# Patient Record
Sex: Female | Born: 1985 | Race: Black or African American | Hispanic: No | Marital: Single | Smoking: Current every day smoker
Health system: Southern US, Community
[De-identification: ages and names within clinical notes are randomized; demographics above are authoritative.]

## PROBLEM LIST (undated history)

## (undated) HISTORY — PX: TUBAL LIGATION: SHX77

## (undated) HISTORY — PX: ESSURE TUBAL LIGATION: SUR464

## (undated) HISTORY — PX: DILATION AND CURETTAGE OF UTERUS: SHX78

---

## 2010-06-26 ENCOUNTER — Emergency Department: Payer: Self-pay | Admitting: Emergency Medicine

## 2010-09-18 ENCOUNTER — Emergency Department: Payer: Self-pay | Admitting: Unknown Physician Specialty

## 2011-01-15 ENCOUNTER — Encounter: Payer: Self-pay | Admitting: Maternal and Fetal Medicine

## 2011-03-08 ENCOUNTER — Encounter: Payer: Self-pay | Admitting: Obstetrics and Gynecology

## 2011-04-30 ENCOUNTER — Observation Stay: Payer: Self-pay

## 2011-05-17 ENCOUNTER — Encounter: Payer: Self-pay | Admitting: Obstetrics & Gynecology

## 2011-07-10 ENCOUNTER — Observation Stay: Payer: Self-pay | Admitting: Obstetrics and Gynecology

## 2011-07-11 ENCOUNTER — Inpatient Hospital Stay: Payer: Self-pay

## 2011-07-11 LAB — CBC WITH DIFFERENTIAL/PLATELET
Basophil #: 0 10*3/uL (ref 0.0–0.1)
Basophil %: 0.3 %
Eosinophil #: 0.1 10*3/uL (ref 0.0–0.7)
Eosinophil %: 0.4 %
HCT: 34.7 % — ABNORMAL LOW (ref 35.0–47.0)
HGB: 11.7 g/dL — ABNORMAL LOW (ref 12.0–16.0)
MCHC: 33.9 g/dL (ref 32.0–36.0)
Monocyte #: 1.1 x10 3/mm — ABNORMAL HIGH (ref 0.2–0.9)
Monocyte %: 7.2 %
Neutrophil %: 80.5 %
RBC: 3.85 10*6/uL (ref 3.80–5.20)
RDW: 13.8 % (ref 11.5–14.5)

## 2011-07-12 LAB — HEMATOCRIT: HCT: 30.2 % — ABNORMAL LOW (ref 35.0–47.0)

## 2011-07-16 LAB — PATHOLOGY REPORT

## 2011-12-03 ENCOUNTER — Emergency Department: Payer: Self-pay | Admitting: Emergency Medicine

## 2012-06-02 ENCOUNTER — Emergency Department: Payer: Self-pay | Admitting: Emergency Medicine

## 2013-08-25 ENCOUNTER — Emergency Department: Payer: Self-pay | Admitting: Emergency Medicine

## 2013-08-25 LAB — BASIC METABOLIC PANEL
Anion Gap: 7 (ref 7–16)
BUN: 13 mg/dL (ref 7–18)
CALCIUM: 8.6 mg/dL (ref 8.5–10.1)
CHLORIDE: 106 mmol/L (ref 98–107)
Co2: 26 mmol/L (ref 21–32)
Creatinine: 0.8 mg/dL (ref 0.60–1.30)
EGFR (Non-African Amer.): >60
Glucose: 84 mg/dL (ref 65–99)
Osmolality: 277 (ref 275–301)
Potassium: 4.1 mmol/L (ref 3.5–5.1)
Sodium: 139 mmol/L (ref 136–145)

## 2013-08-25 LAB — CBC WITH DIFFERENTIAL/PLATELET
BASOS ABS: 0 10*3/uL (ref 0.0–0.1)
Basophil %: 0.7 %
EOS ABS: 0.1 10*3/uL (ref 0.0–0.7)
EOS PCT: 2.1 %
HCT: 38.7 % (ref 35.0–47.0)
HGB: 12.8 g/dL (ref 12.0–16.0)
Lymphocyte #: 2 10*3/uL (ref 1.0–3.6)
Lymphocyte %: 36.6 %
MCH: 30.8 pg (ref 26.0–34.0)
MCHC: 33.2 g/dL (ref 32.0–36.0)
MCV: 93 fL (ref 80–100)
Monocyte #: 0.6 x10 3/mm (ref 0.2–0.9)
Monocyte %: 10.3 %
NEUTROS PCT: 50.3 %
Neutrophil #: 2.7 10*3/uL (ref 1.4–6.5)
Platelet: 123 10*3/uL — ABNORMAL LOW (ref 150–440)
RBC: 4.17 10*6/uL (ref 3.80–5.20)
RDW: 13.6 % (ref 11.5–14.5)
WBC: 5.4 10*3/uL (ref 3.6–11.0)

## 2013-08-25 LAB — TSH: THYROID STIMULATING HORM: 0.423 u[IU]/mL — AB

## 2013-09-04 ENCOUNTER — Emergency Department: Payer: Self-pay | Admitting: Emergency Medicine

## 2013-10-10 ENCOUNTER — Emergency Department: Payer: Self-pay | Admitting: Emergency Medicine

## 2014-06-13 NOTE — Op Note (Signed)
PATIENT NAME:  Primus BravoRKER, Maria L MR#:  [id number] DATE OF BIRTH:  1985-11-20  DATE OF PROCEDURE:  07/12/2011  PREOPERATIVE DIAGNOSIS: Postpartum, desires sterilization.   POSTOPERATIVE DIAGNOSIS: Postpartum, desires sterilization.   PROCEDURE:  Bilateral partial salpingectomy.   SURGEON: Deloris Pinghilip J. Luella Cookosenow, M.D.   ANESTHESIA: General.   OPERATIVE FINDINGS: Normal appearing tubes.   DESCRIPTION OF PROCEDURE:   After adequate general anesthesia, the patient was prepped and draped in routine fashion. An infraumbilical incision was made through the skin and carried down the various layers and the peritoneal cavity was entered. The right tube was grasped and after adequate identification of the right fimbria, the right tube was doubly ligated and a portion removed. A like procedure was carried out on the other side. Both areas of surgery were inspected and found to be hemostatic. The peritoneum was closed with a pursestring suture. The fascia was reapproximated with two interrupted sutures of Vicryl and the skin was closed with subcuticular stitch. The patient tolerated the procedure well and left the Operating Room in good condition. Sponge and needle counts were said to be correct at the end of the procedure.   ____________________________ Deloris PingPhilip J. Luella Cookosenow, MD pjr:ap D: 07/12/2011 13:42:22 ET T: 07/12/2011 16:08:02 ET JOB#: [id number]  cc: Deloris PingPhilip J. Luella Cookosenow, MD, <Dictator>  Towana BadgerPHILIP J Melayah Skorupski MD ELECTRONICALLY SIGNED 07/13/2011 17:04

## 2014-06-29 NOTE — H&P (Signed)
L&D Evaluation:  History:   HPI 29 yo G4 P2012 with EDD of 07/24/11 per LMP and 13 week US presents at 6838 1/7 with c/o strong regular contractions since 0600 this AM. Denies LOF or VB. PNC at [facility] notable for early entry to care, h/o IUFD at 22 weeks (pt declined DP consult), mild anemia, normal growth scans, 2 prior term vaginal deliveries.Desires BTL and 30 day papers signed 03/15/2011. LABS: A POS, VI, RI, GBs negative. Recieved TDAP this pregnancy.    Presents with contractions    Patient's Medical History No Chronic Illness  seizure at age 89 r/t fever (?)    Patient's Surgical History D&C    Medications Pre [place]atal Vitamins    Allergies NKDA    Social History tobacco  quit with pregnancy    Family History Non-Contributory   ROS:   ROS see HPI   Exam:   General breathing with xcontractions    Mental Status clear    Chest clear    Heart normal sinus rhythm, no murmur/gallop/rubs    Abdomen gravid, tender with contractions    Estimated Fetal Weight Average for gestational age    Fetal Position vttx    Edema trace edema    Reflexes 1+    Pelvic 4/C/-1    Mebranes Intact    FHT normal rate with no decels, 135 with accels to 150    FHT Description mod variability    Ucx strong q3-4    Skin dry   Impression:   Impression IUP at 38 1/7 weeks in active labor   Plan:   Plan monitor contractions and for cervical change, Expectant management. Stadol for pain   Electronic Signatures: Trinna BalloonGutierrez, Halle Davlin L (CNM)  (Signed 22-May-13 08:36)  Authored: L&D Evaluation   Last Updated: 22-May-13 08:36 by Trinna BalloonGutierrez, Rickia Freeburg L (CNM)

## 2014-06-29 NOTE — H&P (Signed)
L&D Evaluation:  History Expanded:   HPI 29 yo G4 P2012 with EDD of 07/24/11 per LMP and 13 week US. PNC at [facility] notable for early entry to care, h/o IUFD at 22 weeks (pt declined DP consult), 2 prior term vaginal deliveries.  Presents today with c/o groin/low pubic pain most notable at night with movement. Does not believe that she is having contractions. Denies LOF or VB. Good FM.    Blood Type A positive    Group B Strep Results (Result >5wks must be treated as unknown) unknown/result > 5 weeks ago    Maternal HIV Negative    Maternal Syphilis Ab Nonreactive    Maternal Varicella Immune    Rubella Results immune    Maternal T-Dap Nonimmune    Patient's Medical History No Chronic Illness  seizure at age 89 r/t fever (?)    Patient's Surgical History none    Medications Pre Natal Vitamins    Allergies NKDA    Social History tobacco   ROS:   ROS see HPI   Exam:   Vital Signs stable    General no apparent distress    Mental Status clear    Chest clear    Heart no murmur/gallop/rubs    Abdomen gravid, non-tender    Pelvic deferred as pt is not contracting    Mebranes Intact    FHT appropriate for gestational age    Ucx absent    Skin dry   Impression:   Impression Round ligament pain   Plan:   Comments D/c home F/u with [facility] as scheduled on 3/14   Electronic Signatures: Vella KohlerBrothers, Meric Joye K (CNM)  (Signed 11-Mar-13 12:27)  Authored: L&D Evaluation   Last Updated: 11-Mar-13 12:27 by Vella KohlerBrothers, Yared Barefoot K (CNM)

## 2014-07-21 ENCOUNTER — Emergency Department
Admission: EM | Admit: 2014-07-21 | Discharge: 2014-07-21 | Discharge status: Against Medical Advice | Payer: No Typology Code available for payment source | Attending: Emergency Medicine | Admitting: Emergency Medicine

## 2014-07-21 DIAGNOSIS — S4990XA Unspecified injury of shoulder and upper arm, unspecified arm, initial encounter: Secondary | ICD-10-CM | POA: Insufficient documentation

## 2014-07-21 DIAGNOSIS — Y92410 Unspecified street and highway as the place of occurrence of the external cause: Secondary | ICD-10-CM | POA: Insufficient documentation

## 2014-07-21 DIAGNOSIS — Y939 Activity, unspecified: Secondary | ICD-10-CM | POA: Insufficient documentation

## 2014-07-21 DIAGNOSIS — Y999 Unspecified external cause status: Secondary | ICD-10-CM | POA: Insufficient documentation

## 2014-07-21 DIAGNOSIS — S3992XA Unspecified injury of lower back, initial encounter: Secondary | ICD-10-CM | POA: Insufficient documentation

## 2014-07-21 NOTE — ED Notes (Signed)
No answer when called from lobby 

## 2015-10-07 ENCOUNTER — Emergency Department
Admission: EM | Admit: 2015-10-07 | Discharge: 2015-10-07 | Disposition: A | Discharge status: Home / Self Care | Payer: Self-pay | Attending: Emergency Medicine | Admitting: Emergency Medicine

## 2015-10-07 ENCOUNTER — Encounter: Payer: Self-pay | Admitting: Emergency Medicine

## 2015-10-07 DIAGNOSIS — B009 Herpesviral infection, unspecified: Secondary | ICD-10-CM | POA: Insufficient documentation

## 2015-10-07 DIAGNOSIS — F17200 Nicotine dependence, unspecified, uncomplicated: Secondary | ICD-10-CM | POA: Insufficient documentation

## 2015-10-07 MED ORDER — ACYCLOVIR 800 MG PO TABS: 800.0000 mg | ORAL_TABLET | Freq: Every day | ORAL | 0 refills | Status: DC

## 2015-10-07 NOTE — ED Triage Notes (Signed)
Pt has had small bump on left lower lip for couple days but reports got bigger today. Thinks may have got bit by something. Minimal swelling. No respiratory difficulty. Burning feeling to area.

## 2015-10-07 NOTE — ED Provider Notes (Signed)
[facility] Emergency Department Provider Note  ____________________________________________  Time seen: Approximately 2:15 PM  I have reviewed the triage vital signs and the nursing notes.   HISTORY  Chief Complaint Insect Bite    HPI Primus Bravoshley L Bonus is a 30 y.o. female who presents emergency department for a "bump" to the left lower lip. Patient reports that symptoms have been present times "a few days." It is increasing in size. She denies any pruritus but endorses a "burning sensation." Patient denies any other symptoms to include URI symptoms, fevers or chills, abdominal pain, nausea or vomiting. Patient is not trying medications for this complaint prior to arrival.   History reviewed. No pertinent past medical history.  There are no active problems to display for this patient.   Past Surgical History:  Procedure Laterality Date  . DILATION AND CURETTAGE OF UTERUS    . ESSURE TUBAL LIGATION      Prior to Admission medications   Medication Sig Start Date End Date Taking? Authorizing Provider  acyclovir (ZOVIRAX) 800 MG tablet Take 1 tablet (800 mg total) by mouth 5 (five) times daily. 10/07/15   Delorise RoyalsJonathan D Emilyrose Darrah, PA-C    Allergies Review of patient's allergies indicates no known allergies.  History reviewed. No pertinent family history.  Social History Social History  Substance Use Topics  . Smoking status: Current Every Day Smoker  . Smokeless tobacco: Never Used  . Alcohol use No     Review of Systems  Constitutional: No fever/chills Eyes: No visual changes. No discharge ENT: Positive for "bump" to the left lower lip. Cardiovascular: no chest pain. Respiratory: no cough. No SOB. Gastrointestinal: No abdominal pain.  No nausea, no vomiting.   Musculoskeletal: Negative for musculoskeletal pain. Skin: Negative for rash, abrasions, lacerations, ecchymosis. Neurological: Negative for headaches, focal weakness or numbness. 10-point  ROS otherwise negative.  ____________________________________________   PHYSICAL EXAM:  VITAL SIGNS: ED Triage Vitals [10/07/15 1329]  Enc Vitals Group     BP 128/84     Pulse Rate 77     Resp 16     Temp 99.2 F (37.3 C)     Temp Source Oral     SpO2 100 %     Weight 119 lb (54 kg)     Height 4\' 11"  (1.499 m)     Head Circumference      Peak Flow      Pain Score 5     Pain Loc      Pain Edu?      Excl. in GC?      Constitutional: Alert and oriented. Well appearing and in no acute distress. Eyes: Conjunctivae are normal. PERRL. EOMI. Head: Atraumatic. ENT:      Ears:       Nose: No congestion/rhinnorhea.      Mouth/Throat: Mucous membranes are moist. Oropharynx nonerythematous and nonedematous. There is a erythematous lesion noted to the left lower lip. This appears to be a cold sore. Neck: No stridor.   Hematological/Lymphatic/Immunilogical: No cervical lymphadenopathy. Cardiovascular: Normal rate, regular rhythm. Normal S1 and S2.  Good peripheral circulation. Respiratory: Normal respiratory effort without tachypnea or retractions. Lungs CTAB. Good air entry to the bases with no decreased or absent breath sounds. Musculoskeletal: Full range of motion to all extremities. No gross deformities appreciated. Neurologic:  Normal speech and language. No gross focal neurologic deficits are appreciated.  Skin:  Skin is warm, dry and intact. No rash noted. Psychiatric: Mood and affect are normal. Speech  and behavior are normal. Patient exhibits appropriate insight and judgement.   ____________________________________________   LABS (all labs ordered are listed, but only abnormal results are displayed)  Labs Reviewed - No data to display ____________________________________________  EKG   ____________________________________________  RADIOLOGY   No results found.  ____________________________________________    PROCEDURES  Procedure(s) performed:     Procedures    Medications - No data to display   ____________________________________________   INITIAL IMPRESSION / ASSESSMENT AND PLAN / ED COURSE  Pertinent labs & imaging results that were available during my care of the patient were reviewed by me and considered in my medical decision making (see chart for details).  Clinical Course    Patient's diagnosis is consistent with HSV 1 cold sore to the left lower lip.. Patient will be discharged home with prescriptions for antivirals. Patient is to follow up with primary care provider as needed or otherwise directed. Patient is given ED precautions to return to the ED for any worsening or new symptoms.     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 Despite best efforts to proofread, errors can occur which can change the meaning. Any change was purely unintentional.    Racheal PatchesJonathan D Mikalyn Hermida, PA-C 10/07/15 1536    Minna AntisKevin Paduchowski, MD 10/07/15 [phone]

## 2016-07-24 ENCOUNTER — Other Ambulatory Visit: Payer: Self-pay | Admitting: Primary Care

## 2016-07-24 DIAGNOSIS — N921 Excessive and frequent menstruation with irregular cycle: Secondary | ICD-10-CM

## 2016-07-27 ENCOUNTER — Ambulatory Visit: Payer: Self-pay

## 2016-09-10 ENCOUNTER — Ambulatory Visit: Payer: No Typology Code available for payment source

## 2020-01-25 ENCOUNTER — Encounter
Admission: RE | Admit: 2020-01-25 | Discharge: 2020-01-25 | Disposition: A | Discharge status: Home / Self Care | Payer: No Typology Code available for payment source | Source: Ambulatory Visit | Attending: Obstetrics & Gynecology | Admitting: Obstetrics & Gynecology

## 2020-01-25 ENCOUNTER — Other Ambulatory Visit: Payer: Self-pay

## 2020-01-25 NOTE — Patient Instructions (Signed)
Your procedure is scheduled on: 02/02/20 Report to [facility]. To find out your arrival time please call [phone] between 1PM - 3PM on 02/01/20.  Remember: Instructions that are not followed completely may result in serious medical risk, up to and including death, or upon the discretion of your surgeon and anesthesiologist your surgery may need to be rescheduled.     _X__ 1. Do not eat food after midnight the night before your procedure.                 No gum chewing or hard candies. You may drink clear liquids up to 2 hours                 before you are scheduled to arrive for your surgery- DO not drink clear                 liquids within 2 hours of the start of your surgery.                 Clear Liquids include:  water, apple juice without pulp, clear carbohydrate                 drink such as Clearfast or Gatorade, Black Coffee or Tea (Do not add                 anything to coffee or tea). Diabetics water only  __X__2.  On the morning of surgery brush your teeth with toothpaste and water, you                 may rinse your mouth with mouthwash if you wish.  Do not swallow any              toothpaste of mouthwash.     _X__ 3.  No Alcohol for 24 hours before or after surgery.   _X__ 4.  Do Not Smoke or use e-cigarettes For 24 Hours Prior to Your Surgery.                 Do not use any chewable tobacco products for at least 6 hours prior to                 surgery.  ____  5.  Bring all medications with you on the day of surgery if instructed.   __X__  6.  Notify your doctor if there is any change in your medical condition      (cold, fever, infections).     Do not wear jewelry, make-up, hairpins, clips or nail polish. Do not wear lotions, powders, or perfumes.  Do not shave 48 hours prior to surgery. Men may shave face and neck. Do not bring valuables to the hospital.    [facility] is not responsible for any belongings  or valuables.  Contacts, dentures/partials or body piercings may not be worn into surgery. Bring a case for your contacts, glasses or hearing aids, a denture cup will be supplied. Leave your suitcase in the car. After surgery it may be brought to your room. For patients admitted to the hospital, discharge time is determined by your treatment team.   Patients discharged the day of surgery will not be allowed to drive home.   Please read over the following fact sheets that you were given:   MRSA Information  __X__ Take these medicines the morning of surgery with A SIP OF WATER:  1. none  2.   3.   4.  5.  6.  ____ Fleet Enema (as directed)   __X__ Use CHG Soap/SAGE wipes as directed  ____ Use inhalers on the day of surgery  ____ Stop metformin/Janumet/Farxiga 2 days prior to surgery    ____ Take 1/2 of usual insulin dose the night before surgery. No insulin the morning          of surgery.   ____ Stop Blood Thinners Coumadin/Plavix/Xarelto/Pleta/Pradaxa/Eliquis/Effient/Aspirin  on   Or contact your Surgeon, Cardiologist or Medical Doctor regarding  ability to stop your blood thinners  __X__ Stop Anti-inflammatories 7 days before surgery such as Advil, Ibuprofen, Motrin,  BC or Goodies Powder, Naprosyn, Naproxen, Aleve, Aspirin    __X__ Stop all herbal supplements, fish oil or vitamin E until after surgery.    ____ Bring C-Pap to the hospital.    THE ENSURE "CLEAR" PRE SURGERY DRINK SHOULD BE FINISHED WITHIN 5-10 MINUTES 2 HOURS BEFORE ARRIVING TO SURGERY

## 2020-01-28 ENCOUNTER — Other Ambulatory Visit: Payer: Self-pay | Admitting: Obstetrics & Gynecology

## 2020-01-29 ENCOUNTER — Other Ambulatory Visit: Payer: Self-pay

## 2020-01-29 ENCOUNTER — Other Ambulatory Visit
Admission: RE | Admit: 2020-01-29 | Discharge: 2020-01-29 | Disposition: A | Discharge status: Home / Self Care | Payer: No Typology Code available for payment source | Source: Ambulatory Visit | Attending: Obstetrics & Gynecology | Admitting: Obstetrics & Gynecology

## 2020-01-29 DIAGNOSIS — Z01812 Encounter for preprocedural laboratory examination: Secondary | ICD-10-CM | POA: Diagnosis present

## 2020-01-29 DIAGNOSIS — Z20822 Contact with and (suspected) exposure to covid-19: Secondary | ICD-10-CM | POA: Diagnosis not present

## 2020-01-29 LAB — BASIC METABOLIC PANEL
Anion gap: 10 (ref 5–15)
BUN: 10 mg/dL (ref 6–20)
CO2: 23 mmol/L (ref 22–32)
Calcium: 8.9 mg/dL (ref 8.9–10.3)
Chloride: 105 mmol/L (ref 98–111)
Creatinine, Ser: 0.63 mg/dL (ref 0.44–1.00)
GFR, Estimated: >60 mL/min (ref >60)
Glucose, Bld: 95 mg/dL (ref 70–99)
Potassium: 3.7 mmol/L (ref 3.5–5.1)
Sodium: 138 mmol/L (ref 135–145)

## 2020-01-29 LAB — CBC
HCT: 38.8 % (ref 36.0–46.0)
Hemoglobin: 12.9 g/dL (ref 12.0–15.0)
MCH: 30.7 pg (ref 26.0–34.0)
MCHC: 33.2 g/dL (ref 30.0–36.0)
MCV: 92.4 fL (ref 80.0–100.0)
Platelets: 141 10*3/uL — ABNORMAL LOW (ref 150–400)
RBC: 4.2 MIL/uL (ref 3.87–5.11)
RDW: 13.5 % (ref 11.5–15.5)
WBC: 5 10*3/uL (ref 4.0–10.5)
nRBC: 0 % (ref 0.0–0.2)

## 2020-01-30 LAB — TYPE AND SCREEN
ABO/RH(D): A POS
Antibody Screen: NEGATIVE

## 2020-01-30 LAB — SARS CORONAVIRUS 2 (TAT 6-24 HRS): SARS Coronavirus 2: NEGATIVE

## 2020-02-02 ENCOUNTER — Ambulatory Visit: Payer: No Typology Code available for payment source | Admitting: Certified Registered"

## 2020-02-02 ENCOUNTER — Other Ambulatory Visit: Payer: Self-pay

## 2020-02-02 ENCOUNTER — Encounter: Payer: Self-pay | Admitting: Obstetrics & Gynecology

## 2020-02-02 ENCOUNTER — Ambulatory Visit
Admission: RE | Admit: 2020-02-02 | Discharge: 2020-02-02 | Disposition: A | Discharge status: Home / Self Care | Payer: No Typology Code available for payment source | Attending: Obstetrics & Gynecology | Admitting: Obstetrics & Gynecology

## 2020-02-02 ENCOUNTER — Encounter
Admission: RE | Disposition: A | Discharge status: Home / Self Care | Payer: Self-pay | Source: Home / Self Care | Attending: Obstetrics & Gynecology

## 2020-02-02 DIAGNOSIS — N921 Excessive and frequent menstruation with irregular cycle: Secondary | ICD-10-CM | POA: Diagnosis present

## 2020-02-02 DIAGNOSIS — Z791 Long term (current) use of non-steroidal anti-inflammatories (NSAID): Secondary | ICD-10-CM | POA: Insufficient documentation

## 2020-02-02 DIAGNOSIS — N72 Inflammatory disease of cervix uteri: Secondary | ICD-10-CM | POA: Diagnosis not present

## 2020-02-02 DIAGNOSIS — F17210 Nicotine dependence, cigarettes, uncomplicated: Secondary | ICD-10-CM | POA: Diagnosis not present

## 2020-02-02 DIAGNOSIS — N800 Endometriosis of uterus: Secondary | ICD-10-CM | POA: Diagnosis not present

## 2020-02-02 HISTORY — PX: TOTAL LAPAROSCOPIC HYSTERECTOMY WITH SALPINGECTOMY: SHX6742

## 2020-02-02 LAB — ABO/RH: ABO/RH(D): A POS

## 2020-02-02 LAB — POCT PREGNANCY, URINE: Preg Test, Ur: NEGATIVE

## 2020-02-02 SURGERY — HYSTERECTOMY, TOTAL, LAPAROSCOPIC, WITH SALPINGECTOMY
Anesthesia: General | Laterality: Bilateral

## 2020-02-02 MED ORDER — DEXAMETHASONE SODIUM PHOSPHATE 10 MG/ML IJ SOLN: 4.0000 mg | INTRAMUSCULAR | Status: AC

## 2020-02-02 MED ORDER — LACTATED RINGERS IV SOLN: INTRAVENOUS | Status: DC

## 2020-02-02 MED ORDER — ACETAMINOPHEN 500 MG PO TABS: 1000.0000 mg | ORAL_TABLET | ORAL | Status: AC

## 2020-02-02 MED ORDER — FAMOTIDINE 20 MG PO TABS: 20.0000 mg | ORAL_TABLET | Freq: Once | ORAL | Status: AC

## 2020-02-02 MED ORDER — HYDROMORPHONE HCL 1 MG/ML IJ SOLN
INTRAMUSCULAR | Status: AC
  Filled 2020-02-02: qty 1

## 2020-02-02 MED ORDER — SUGAMMADEX SODIUM 200 MG/2ML IV SOLN
INTRAVENOUS | Status: DC | PRN
  Administered 2020-02-02: 200 mg via INTRAVENOUS

## 2020-02-02 MED ORDER — CEFAZOLIN SODIUM-DEXTROSE 2-4 GM/100ML-% IV SOLN
2.0000 g | INTRAVENOUS | Status: AC
  Administered 2020-02-02: 11:00:00 2 g via INTRAVENOUS

## 2020-02-02 MED ORDER — FENTANYL CITRATE (PF) 100 MCG/2ML IJ SOLN
INTRAMUSCULAR | Status: AC
  Administered 2020-02-02: 14:00:00 25 ug via INTRAVENOUS
  Filled 2020-02-02: qty 2

## 2020-02-02 MED ORDER — POVIDONE-IODINE 10 % EX SWAB
2.0000 "application " | Freq: Once | CUTANEOUS | Status: AC
  Administered 2020-02-02: 2 via TOPICAL

## 2020-02-02 MED ORDER — KETOROLAC TROMETHAMINE 15 MG/ML IJ SOLN
INTRAMUSCULAR | Status: AC
  Administered 2020-02-02: 14:00:00 15 mg via INTRAVENOUS
  Filled 2020-02-02: qty 1

## 2020-02-02 MED ORDER — FENTANYL CITRATE (PF) 100 MCG/2ML IJ SOLN
INTRAMUSCULAR | Status: AC
  Filled 2020-02-02: qty 2

## 2020-02-02 MED ORDER — ROCURONIUM BROMIDE 100 MG/10ML IV SOLN
INTRAVENOUS | Status: DC | PRN
  Administered 2020-02-02: 40 mg via INTRAVENOUS
  Administered 2020-02-02: 10 mg via INTRAVENOUS

## 2020-02-02 MED ORDER — ORAL CARE MOUTH RINSE: 15.0000 mL | Freq: Once | OROMUCOSAL | Status: AC

## 2020-02-02 MED ORDER — KETOROLAC TROMETHAMINE 15 MG/ML IJ SOLN
INTRAMUSCULAR | Status: AC
  Administered 2020-02-02: 10:00:00 15 mg via INTRAVENOUS
  Filled 2020-02-02: qty 1

## 2020-02-02 MED ORDER — FENTANYL CITRATE (PF) 100 MCG/2ML IJ SOLN
25.0000 ug | INTRAMUSCULAR | Status: DC | PRN
  Administered 2020-02-02 (×3): 25 ug via INTRAVENOUS

## 2020-02-02 MED ORDER — SCOPOLAMINE 1 MG/3DAYS TD PT72
MEDICATED_PATCH | TRANSDERMAL | Status: AC
  Administered 2020-02-02: 10:00:00 1.5 mg via TRANSDERMAL
  Filled 2020-02-02: qty 1

## 2020-02-02 MED ORDER — MIDAZOLAM HCL 2 MG/2ML IJ SOLN
INTRAMUSCULAR | Status: AC
  Filled 2020-02-02: qty 2

## 2020-02-02 MED ORDER — CHLORHEXIDINE GLUCONATE 0.12 % MT SOLN
OROMUCOSAL | Status: AC
  Administered 2020-02-02: 10:00:00 15 mL via OROMUCOSAL
  Filled 2020-02-02: qty 15

## 2020-02-02 MED ORDER — DEXAMETHASONE SODIUM PHOSPHATE 10 MG/ML IJ SOLN
INTRAMUSCULAR | Status: DC | PRN
  Administered 2020-02-02: 10 mg via INTRAVENOUS

## 2020-02-02 MED ORDER — KETOROLAC TROMETHAMINE 15 MG/ML IJ SOLN: 15.0000 mg | INTRAMUSCULAR | Status: AC

## 2020-02-02 MED ORDER — ACETAMINOPHEN 325 MG PO TABS: 650.0000 mg | ORAL_TABLET | ORAL | Status: DC | PRN

## 2020-02-02 MED ORDER — GABAPENTIN 300 MG PO CAPS
ORAL_CAPSULE | ORAL | Status: AC
  Administered 2020-02-02: 10:00:00 600 mg via ORAL
  Filled 2020-02-02: qty 2

## 2020-02-02 MED ORDER — ACETAMINOPHEN 325 MG PO TABS
ORAL_TABLET | ORAL | Status: AC
  Administered 2020-02-02: 14:00:00 650 mg via ORAL
  Filled 2020-02-02: qty 2

## 2020-02-02 MED ORDER — IBUPROFEN 800 MG PO TABS: 800.0000 mg | ORAL_TABLET | Freq: Four times a day (QID) | ORAL | 0 refills | Status: AC

## 2020-02-02 MED ORDER — ACETAMINOPHEN 650 MG RE SUPP
650.0000 mg | RECTAL | Status: DC | PRN
  Filled 2020-02-02: qty 1

## 2020-02-02 MED ORDER — ACETAMINOPHEN 500 MG PO TABS
ORAL_TABLET | ORAL | Status: AC
  Administered 2020-02-02: 10:00:00 1000 mg via ORAL
  Filled 2020-02-02: qty 2

## 2020-02-02 MED ORDER — ENSURE PRE-SURGERY PO LIQD
296.0000 mL | Freq: Once | ORAL | Status: DC
  Filled 2020-02-02: qty 296

## 2020-02-02 MED ORDER — SCOPOLAMINE 1 MG/3DAYS TD PT72: 1.0000 | MEDICATED_PATCH | TRANSDERMAL | Status: DC

## 2020-02-02 MED ORDER — HEPARIN SODIUM (PORCINE) 5000 UNIT/ML IJ SOLN: 5000.0000 [IU] | INTRAMUSCULAR | Status: AC

## 2020-02-02 MED ORDER — HEPARIN SODIUM (PORCINE) 5000 UNIT/ML IJ SOLN
INTRAMUSCULAR | Status: AC
  Administered 2020-02-02: 10:00:00 5000 [IU] via SUBCUTANEOUS
  Filled 2020-02-02: qty 1

## 2020-02-02 MED ORDER — GABAPENTIN 300 MG PO CAPS: 600.0000 mg | ORAL_CAPSULE | ORAL | Status: AC

## 2020-02-02 MED ORDER — OXYCODONE HCL 5 MG PO TABS: 5.0000 mg | ORAL_TABLET | ORAL | 0 refills | Status: AC | PRN

## 2020-02-02 MED ORDER — LIDOCAINE HCL (CARDIAC) PF 100 MG/5ML IV SOSY
PREFILLED_SYRINGE | INTRAVENOUS | Status: DC | PRN
  Administered 2020-02-02: 80 mg via INTRAVENOUS

## 2020-02-02 MED ORDER — FAMOTIDINE 20 MG PO TABS
ORAL_TABLET | ORAL | Status: AC
  Administered 2020-02-02: 10:00:00 20 mg via ORAL
  Filled 2020-02-02: qty 1

## 2020-02-02 MED ORDER — CEFAZOLIN SODIUM-DEXTROSE 2-4 GM/100ML-% IV SOLN
INTRAVENOUS | Status: AC
  Filled 2020-02-02: qty 100

## 2020-02-02 MED ORDER — EPHEDRINE SULFATE 50 MG/ML IJ SOLN
INTRAMUSCULAR | Status: DC | PRN
  Administered 2020-02-02 (×2): 10 mg via INTRAVENOUS

## 2020-02-02 MED ORDER — MIDAZOLAM HCL 2 MG/2ML IJ SOLN
INTRAMUSCULAR | Status: DC | PRN
  Administered 2020-02-02: 2 mg via INTRAVENOUS

## 2020-02-02 MED ORDER — HYDROMORPHONE HCL 1 MG/ML IJ SOLN
INTRAMUSCULAR | Status: DC | PRN
  Administered 2020-02-02 (×2): .2 mg via INTRAVENOUS

## 2020-02-02 MED ORDER — FENTANYL CITRATE (PF) 100 MCG/2ML IJ SOLN
INTRAMUSCULAR | Status: DC | PRN
  Administered 2020-02-02: 50 ug via INTRAVENOUS

## 2020-02-02 MED ORDER — PROPOFOL 10 MG/ML IV BOLUS
INTRAVENOUS | Status: AC
  Filled 2020-02-02: qty 20

## 2020-02-02 MED ORDER — FENTANYL CITRATE (PF) 100 MCG/2ML IJ SOLN: 25.0000 ug | INTRAMUSCULAR | Status: DC | PRN

## 2020-02-02 MED ORDER — KETOROLAC TROMETHAMINE 15 MG/ML IJ SOLN: 15.0000 mg | Freq: Four times a day (QID) | INTRAMUSCULAR | Status: DC

## 2020-02-02 MED ORDER — PROPOFOL 10 MG/ML IV BOLUS
INTRAVENOUS | Status: DC | PRN
  Administered 2020-02-02: 50 mg via INTRAVENOUS
  Administered 2020-02-02: 130 mg via INTRAVENOUS
  Administered 2020-02-02: 20 mg via INTRAVENOUS

## 2020-02-02 MED ORDER — CHLORHEXIDINE GLUCONATE 0.12 % MT SOLN: 15.0000 mL | Freq: Once | OROMUCOSAL | Status: AC

## 2020-02-02 MED ORDER — PHENYLEPHRINE HCL (PRESSORS) 10 MG/ML IV SOLN
INTRAVENOUS | Status: DC | PRN
  Administered 2020-02-02 (×6): 100 ug via INTRAVENOUS
  Administered 2020-02-02: 200 ug via INTRAVENOUS

## 2020-02-02 MED ORDER — ACETAMINOPHEN 500 MG PO TABS: 1000.0000 mg | ORAL_TABLET | Freq: Four times a day (QID) | ORAL | 2 refills | Status: AC

## 2020-02-02 MED ORDER — FENTANYL CITRATE (PF) 100 MCG/2ML IJ SOLN
INTRAMUSCULAR | Status: AC
  Administered 2020-02-02: 13:00:00 25 ug via INTRAVENOUS
  Filled 2020-02-02: qty 2

## 2020-02-02 MED ORDER — ONDANSETRON HCL 4 MG/2ML IJ SOLN
INTRAMUSCULAR | Status: AC
  Filled 2020-02-02: qty 2

## 2020-02-02 MED ORDER — ONDANSETRON HCL 4 MG/2ML IJ SOLN
4.0000 mg | Freq: Once | INTRAMUSCULAR | Status: AC | PRN
  Administered 2020-02-02: 15:00:00 4 mg via INTRAVENOUS

## 2020-02-02 MED ORDER — DEXAMETHASONE SODIUM PHOSPHATE 10 MG/ML IJ SOLN
INTRAMUSCULAR | Status: AC
  Administered 2020-02-02: 10:00:00 4 mg via INTRAVENOUS
  Filled 2020-02-02: qty 1

## 2020-02-02 MED ORDER — ONDANSETRON HCL 4 MG/2ML IJ SOLN
INTRAMUSCULAR | Status: DC | PRN
  Administered 2020-02-02: 4 mg via INTRAVENOUS

## 2020-02-02 SURGICAL SUPPLY — 66 items
ADH SKN CLS APL DERMABOND .7 (GAUZE/BANDAGES/DRESSINGS) ×1
APL PRP STRL LF DISP 70% ISPRP (MISCELLANEOUS) ×2
APL SRG 38 LTWT LNG FL B (MISCELLANEOUS)
APPLICATOR ARISTA FLEXITIP XL (MISCELLANEOUS) IMPLANT
BACTOSHIELD CHG 4% 4OZ (MISCELLANEOUS) ×1
BAG DRN RND TRDRP ANRFLXCHMBR (UROLOGICAL SUPPLIES) ×1
BAG URINE DRAIN 2000ML AR STRL (UROLOGICAL SUPPLIES) ×2 IMPLANT
BINDER ABDOMINAL  9 SM 30-45 (SOFTGOODS) ×1
BINDER ABDOMINAL 12 ML 46-62 (SOFTGOODS) IMPLANT
BINDER ABDOMINAL 9 SM 30-45 (SOFTGOODS) ×1 IMPLANT
BLADE SURG SZ11 CARB STEEL (BLADE) ×2 IMPLANT
CATH FOLEY 2WAY  5CC 16FR (CATHETERS) ×1
CATH FOLEY 2WAY 5CC 16FR (CATHETERS) ×1
CATH URTH 16FR FL 2W BLN LF (CATHETERS) ×1 IMPLANT
CHLORAPREP W/TINT 26 (MISCELLANEOUS) ×4 IMPLANT
COUNTER NEEDLE 20/40 LG (NEEDLE) ×2 IMPLANT
COVER WAND RF STERILE (DRAPES) ×2 IMPLANT
DEFOGGER SCOPE WARMER CLEARIFY (MISCELLANEOUS) ×2 IMPLANT
DERMABOND ADVANCED (GAUZE/BANDAGES/DRESSINGS) ×1
DERMABOND ADVANCED .7 DNX12 (GAUZE/BANDAGES/DRESSINGS) ×1 IMPLANT
DEVICE SUTURE ENDOST 10MM (ENDOMECHANICALS) IMPLANT
DRAPE 3/4 80X56 (DRAPES) ×2 IMPLANT
DRAPE LEGGINS SURG 28X43 STRL (DRAPES) ×2 IMPLANT
DRAPE UNDER BUTTOCK W/FLU (DRAPES) ×2 IMPLANT
ELECT REM PT RETURN 9FT ADLT (ELECTROSURGICAL) ×2
ELECTRODE REM PT RTRN 9FT ADLT (ELECTROSURGICAL) ×1 IMPLANT
GAUZE SPONGE 4X4 16PLY XRAY LF (GAUZE/BANDAGES/DRESSINGS) ×2 IMPLANT
GLOVE PI ORTHOPRO 6.5 (GLOVE) ×6
GLOVE PI ORTHOPRO STRL 6.5 (GLOVE) ×6 IMPLANT
GLOVE SURG SYN 6.5 ES PF (GLOVE) ×12 IMPLANT
GOWN STRL REUS W/ TWL LRG LVL3 (GOWN DISPOSABLE) ×4 IMPLANT
GOWN STRL REUS W/ TWL XL LVL3 (GOWN DISPOSABLE) ×3 IMPLANT
GOWN STRL REUS W/TWL LRG LVL3 (GOWN DISPOSABLE) ×8
GOWN STRL REUS W/TWL XL LVL3 (GOWN DISPOSABLE) ×6
HANDLE YANKAUER SUCT BULB TIP (MISCELLANEOUS) ×2 IMPLANT
HEMOSTAT ARISTA ABSORB 3G PWDR (HEMOSTASIS) ×2 IMPLANT
IRRIGATION STRYKERFLOW (MISCELLANEOUS) IMPLANT
IRRIGATOR STRYKERFLOW (MISCELLANEOUS)
IV LACTATED RINGERS 1000ML (IV SOLUTION) IMPLANT
KIT PINK PAD W/HEAD ARE REST (MISCELLANEOUS) ×2
KIT PINK PAD W/HEAD ARM REST (MISCELLANEOUS) ×1 IMPLANT
KIT TURNOVER CYSTO (KITS) ×2 IMPLANT
L-HOOK LAP DISP 36CM (ELECTROSURGICAL) ×2
LHOOK LAP DISP 36CM (ELECTROSURGICAL) ×1 IMPLANT
LIGASURE LAP MARYLAND 5MM 37CM (ELECTROSURGICAL) ×2 IMPLANT
LIGASURE VESSEL 5MM BLUNT TIP (ELECTROSURGICAL) IMPLANT
MANIFOLD NEPTUNE II (INSTRUMENTS) ×2 IMPLANT
MANIPULATOR VCARE LG CRV RETR (MISCELLANEOUS) IMPLANT
MANIPULATOR VCARE SML CRV RETR (MISCELLANEOUS) IMPLANT
MANIPULATOR VCARE STD CRV RETR (MISCELLANEOUS) ×2 IMPLANT
NS IRRIG 500ML POUR BTL (IV SOLUTION) ×2 IMPLANT
PACK LAP CHOLECYSTECTOMY (MISCELLANEOUS) ×2 IMPLANT
PAD OB MATERNITY 4.3X12.25 (PERSONAL CARE ITEMS) ×2 IMPLANT
PAD PREP 24X41 OB/GYN DISP (PERSONAL CARE ITEMS) ×2 IMPLANT
PENCIL ELECTRO HAND CTR (MISCELLANEOUS) ×2 IMPLANT
SCRUB CHG 4% DYNA-HEX 4OZ (MISCELLANEOUS) ×1 IMPLANT
SET CYSTO W/LG BORE CLAMP LF (SET/KITS/TRAYS/PACK) IMPLANT
SLEEVE ENDOPATH XCEL 5M (ENDOMECHANICALS) ×4 IMPLANT
SURGILUBE 2OZ TUBE FLIPTOP (MISCELLANEOUS) ×2 IMPLANT
SUT ENDO VLOC 180-0-8IN (SUTURE) IMPLANT
SUT MNCRL 4-0 (SUTURE) ×2
SUT MNCRL 4-0 27XMFL (SUTURE) ×1
SUT VIC AB 0 CT1 36 (SUTURE) ×4 IMPLANT
SUTURE MNCRL 4-0 27XMF (SUTURE) ×1 IMPLANT
TROCAR XCEL NON-BLD 5MMX100MML (ENDOMECHANICALS) ×2 IMPLANT
TUBING EVAC SMOKE HEATED PNEUM (TUBING) ×2 IMPLANT

## 2020-02-02 NOTE — Anesthesia Preprocedure Evaluation (Signed)
Anesthesia Evaluation  Patient identified by MRN, date of birth, ID band Patient awake    Reviewed: Allergy & Precautions, H&P , NPO status , Patient's Chart, lab work & pertinent test results, reviewed documented beta blocker date and time   Airway Mallampati: II  TM Distance: >3 FB Neck ROM: full    Dental  (+) Teeth Intact   Pulmonary neg pulmonary ROS, Current Smoker and Patient abstained from smoking.,    Pulmonary exam normal        Cardiovascular Exercise Tolerance: Good negative cardio ROS Normal cardiovascular exam Rhythm:regular Rate:Normal     Neuro/Psych negative neurological ROS  negative psych ROS   GI/Hepatic negative GI ROS, Neg liver ROS,   Endo/Other  negative endocrine ROS  Renal/GU negative Renal ROS  negative genitourinary   Musculoskeletal   Abdominal   Peds  Hematology negative hematology ROS (+)   Anesthesia Other Findings History reviewed. No pertinent past medical history. Past Surgical History: No date: DILATION AND CURETTAGE OF UTERUS No date: ESSURE TUBAL LIGATION No date: TUBAL LIGATION BMI    Body Mass Index: 28.63 kg/m     Reproductive/Obstetrics negative OB ROS                             Anesthesia Physical Anesthesia Plan  ASA: II  Anesthesia Plan: General ETT   Post-op Pain Management:    Induction:   PONV Risk Score and Plan: 3  Airway Management Planned:   Additional Equipment:   Intra-op Plan:   Post-operative Plan:   Informed Consent: I have reviewed the patients History and Physical, chart, labs and discussed the procedure including the risks, benefits and alternatives for the proposed anesthesia with the patient or authorized representative who has indicated his/her understanding and acceptance.     Dental Advisory Given  Plan Discussed with: CRNA  Anesthesia Plan Comments:         Anesthesia Quick Evaluation

## 2020-02-02 NOTE — H&P (Signed)
Day-of-Surgery Preoperative History and Physical   Maria Zhang is a 34 y.o. here for surgical management of menorrhagia and cervical dysplasia.   Preoperative concerns have been addressed.   HPI: Maria Zhang is an established patient with a history of menorrhagia and dysmenorrhea. She has been on Depo-provera for +7 years and will have menstrual bleeding halfway through 3 month shot: heavy like a period and continues bleeding until a week after she receives her shot. She declined other hormonal and non-hormonal options, endometrial ablation and requested a hysterectomy.   Returns today for preoperative exam, endometrial biopsy and pap smear.   Workup:  Pap: 2020 [facility]- colpo benign ECC and cervical biopsies, Pap: 2021 [facility]- colpo CIN 2 biopsy and ECC  EMBx: no hyperplasia or carcinoma  TVUS: 11/2016 Uterus is anteverted, no abnormalities, 7 x 5 x 3cm  EE 55mm LO: 3 x 2 x 2cm 34ml vol RO 3.5 x 2 x 1.5cm 40ml vol No cystic structures, no masses No free fluid   Planned procedure:  Total Laparoscopic Hysterectomy with Bilateral Salpingectomy   History reviewed. No pertinent past medical history. Past Surgical History:  Procedure Laterality Date  . DILATION AND CURETTAGE OF UTERUS    . ESSURE TUBAL LIGATION    . TUBAL LIGATION     OB History  No obstetric history on file.  Patient denies any other pertinent gynecologic issues.   No current facility-administered medications on file prior to encounter.   Current Outpatient Medications on File Prior to Encounter  Medication Sig Dispense Refill  . naproxen (NAPROSYN) 500 MG tablet Take 500 mg by mouth daily as needed for mild pain or moderate pain.     No Known Allergies  Social History:   reports that she has been smoking cigarettes. She has been smoking about 0.50 packs per day. She has never used smokeless tobacco. She reports that she does not drink alcohol and does not use drugs.  History reviewed. No pertinent family  history.  Review of Systems: Noncontributory  PHYSICAL EXAM: Blood pressure 100/66, pulse 68, temperature 97.7 F (36.5 C), temperature source Tympanic, resp. rate 18, height 4\' 10"  (1.473 m), weight 62.1 kg, last menstrual period 01/23/2020, SpO2 100 %. General appearance - alert, well appearing, and in no distress Chest - clear to auscultation, symmetric air entry, normal respiratory effort Heart - normal rate and regular rhythm Abdomen - soft, nontender, non-distended Pelvic - examination not performed in preop area Extremities - peripheral pulses normal, no pedal edema, no clubbing or cyanosis  Labs: Results for orders placed or performed during the hospital encounter of 02/02/20 (from the past 336 hour(s))  Pregnancy, urine POC   Collection Time: 02/02/20  9:19 AM  Result Value Ref Range   Preg Test, Ur NEGATIVE NEGATIVE  Results for orders placed or performed during the hospital encounter of 01/29/20 (from the past 336 hour(s))  SARS CORONAVIRUS 2 (TAT 6-24 HRS) Nasopharyngeal Nasopharyngeal Swab   Collection Time: 01/29/20  9:22 AM   Specimen: Nasopharyngeal Swab  Result Value Ref Range   SARS Coronavirus 2 NEGATIVE NEGATIVE  CBC   Collection Time: 01/29/20  9:22 AM  Result Value Ref Range   WBC 5.0 4.0 - 10.5 K/uL   RBC 4.20 3.87 - 5.11 MIL/uL   Hemoglobin 12.9 12.0 - 15.0 g/dL   HCT 14/10/21 44.0 - 34.7 %   MCV 92.4 80.0 - 100.0 fL   MCH 30.7 26.0 - 34.0 pg   MCHC 33.2 30.0 - 36.0 g/dL  RDW 13.5 11.5 - 15.5 %   Platelets 141 (L) 150 - 400 K/uL   nRBC 0.0 0.0 - 0.2 %  Basic metabolic panel   Collection Time: 01/29/20  9:22 AM  Result Value Ref Range   Sodium 138 135 - 145 mmol/L   Potassium 3.7 3.5 - 5.1 mmol/L   Chloride 105 98 - 111 mmol/L   CO2 23 22 - 32 mmol/L   Glucose, Bld 95 70 - 99 mg/dL   BUN 10 6 - 20 mg/dL   Creatinine, Ser 5.63 0.44 - 1.00 mg/dL   Calcium 8.9 8.9 - 87.5 mg/dL   GFR, Estimated >64 >33 mL/min   Anion gap 10 5 - 15  Type and screen  [facility]   Collection Time: 01/29/20  9:22 AM  Result Value Ref Range   ABO/RH(D) A POS    Antibody Screen NEG    Sample Expiration 02/12/2020,2359    Extend sample reason      NO TRANSFUSIONS OR PREGNANCY IN THE PAST 3 MONTHS Performed at [facility], [address] [place], [place] [address]     Assessment: Menorrhagia, cervical dysplasia  Plan: Patient will undergo surgical management with total laparoscopic hysterectomy, bilateral salpingectomy.   The risks of surgery were discussed in detail with the patient including but not limited to: bleeding which may require transfusion or reoperation; infection which may require antibiotics; injury to surrounding organs which may involve bowel, bladder, ureters ; need for additional procedures including laparoscopy or laparotomy; thromboembolic phenomenon, surgical site problems and other postoperative/anesthesia complications. Likelihood of success in alleviating the patient's condition was discussed. Routine postoperative instructions will be reviewed with the patient and her family in detail after surgery.  The patient concurred with the proposed plan, giving informed written consent for the surgery.  Patient has been NPO since last night she will remain NPO for procedure.  Anesthesia and OR aware.  Preoperative prophylactic antibiotics and SCDs ordered on call to the OR.  To OR when ready.  ----- Ranae Plumber, MD, FACOG Attending Obstetrician and Gynecologist [facility], Department of OB/GYN [facility]  02/02/2020 10:07 AM

## 2020-02-02 NOTE — Anesthesia Procedure Notes (Signed)
Procedure Name: Intubation Date/Time: 02/02/2020 10:44 AM Performed by: Rodney Booze, CRNA Pre-anesthesia Checklist: Patient identified, Emergency Drugs available, Suction available and Patient being monitored Patient Re-evaluated:Patient Re-evaluated prior to induction Oxygen Delivery Method: Circle system utilized Preoxygenation: Pre-oxygenation with 100% oxygen Induction Type: IV induction Ventilation: Mask ventilation without difficulty Laryngoscope Size: Miller and 2 Grade View: Grade I Tube type: Oral Tube size: 7.0 mm Number of attempts: 1 Airway Equipment and Method: Stylet and Oral airway Placement Confirmation: ETT inserted through vocal cords under direct vision,  positive ETCO2 and breath sounds checked- equal and bilateral Secured at: 19 cm Tube secured with: Tape Dental Injury: Teeth and Oropharynx as per pre-operative assessment

## 2020-02-02 NOTE — Transfer of Care (Signed)
Immediate Anesthesia Transfer of Care Note  Patient: Maria Zhang  Procedure(s) Performed: TOTAL LAPAROSCOPIC HYSTERECTOMY WITH BILATERAL SALPINGECTOMY (Bilateral )  Patient Location: PACU  Anesthesia Type:General  Level of Consciousness: awake and alert   Airway & Oxygen Therapy: Patient Spontanous Breathing and Patient connected to face mask oxygen  Post-op Assessment: Report given to RN and Post -op Vital signs reviewed and stable  Post vital signs: stable  Last Vitals:  Vitals Value Taken Time  BP 120/60 02/02/20 1250  Temp    Pulse    Resp 22 02/02/20 1253  SpO2    Vitals shown include unvalidated device data.  Last Pain:  Vitals:   02/02/20 0931  TempSrc: Tympanic  PainSc: 0-No pain         Complications: No complications documented.

## 2020-02-02 NOTE — Discharge Instructions (Addendum)
Discharge instructions:  Call office if you have any of the following: fever >101 F, chills, shortness of breath, excessive vaginal bleeding, incision drainage or problems, leg pain or redness, or any other concerns.   Activity: Do not lift > 20 lbs for 8 weeks.  No intercourse or tampons for 8 weeks.  No driving until you are certain you can slam on the brakes, and of course never while taking narcotics.   You may feel some pain in your upper right abdomen/rib and right shoulder.  This is from the gas in the abdomen for surgery. This will subside over time, please be patient!  Take 800mg  Ibuprofen and 1000mg  Tylenol together, around the clock, every 6 hours for at least the first 3-5 days.  After this you can take as needed.  This will help decrease inflammation and promote healing.  The narcotics you'll take just as needed, as they just trick your brain into thinking its not in pain.  Please wear the abdominal binder as often as you can, especially for the first week.  Please don't limit yourself in terms of routine activity.  You will be able to do most things, although they may take longer to do or be a little painful.  You can do it!  Don't be a hero, but don't be a wimp either!     AMBULATORY SURGERY  DISCHARGE INSTRUCTIONS  1) The drugs that you were given will stay in your system until tomorrow so for the next 24 hours you should not:  A) Drive an automobile B) Make any legal decisions C) Drink any alcoholic beverage  2) You may resume regular meals tomorrow.  Today it is better to start with liquids and gradually work up to solid foods. You may eat anything you prefer, but it is better to start with liquids, then soup and crackers, and gradually work up to solid foods.  3) Please notify your doctor immediately if you have any unusual bleeding, trouble breathing, redness and pain at the surgery site, drainage, fever, or pain not relieved by medication.  4) Additional  Instructions:   Please contact your physician with any problems or Same Day Surgery at [phone], Monday through Friday 6 am to 4 pm, or [place] [facility] number at [phone].

## 2020-02-02 NOTE — Op Note (Signed)
Total Laparoscopic Hysterectomy Operative Note Procedure Date: 02/02/2020  Patient:  Maria Zhang  34 y.o. female  PRE-OPERATIVE DIAGNOSIS:  menometrorrhagia  POST-OPERATIVE DIAGNOSIS:  menometrorrhagia  PROCEDURE:  Procedure(s): TOTAL LAPAROSCOPIC HYSTERECTOMY WITH BILATERAL SALPINGECTOMY (Bilateral)  SURGEON:  Surgeon(s) and Role:    * Saanvika Vazques, Elenora Fender, MD - Primary    * Christeen Douglas, MD - Assisting  ANESTHESIA:  General via ET  I/O  Total I/O In: 1000 [I.V.:1000] Out: 125 [Urine:100; Blood:25]  FINDINGS:  Small uterus, normal ovaries and previously ligated fallopian tubes bilaterally.  Normal upper abdomen.  SPECIMEN: Uterus, Cervix, and bilateral fallopian tubes  COMPLICATIONS: none apparent  DISPOSITION: vital signs stable to PACU  Indication for Surgery: 33 y.o. with lifetime menorrhagia, presents for definitive management.  Risks of surgery were discussed with the patient including but not limited to: bleeding which may require transfusion or reoperation; infection which may require antibiotics; injury to bowel, bladder, ureters or other surrounding organs; need for additional procedures including laparotomy, blood clot, incisional problems and other postoperative/anesthesia complications. Written informed consent was obtained.      PROCEDURE IN DETAIL:  The patient had 5000u Heparin Sub-q and sequential compression devices applied to her lower extremities while in the preoperative area.  She was then taken to the operating room. IV antibiotics were given. General anesthesia was administered via endotracheal route.  She was placed in the dorsal lithotomy position, and was prepped and draped in a sterile manner. A surgical time-out was performed.  A Foley catheter was inserted into her bladder and attached to constant drainage and a V-Care uterine manipulator was then advanced into the uterus and a good fit around the cervix was noted. The gloves were changed, and  attention was turned to the abdomen where an umbilical incision was made with the scalpel.  A 27mm trochar was inserted in the umbilical incision using a visiport method.Opening pressure was , and the abdomen was insufflated to carbon dioxide gas and adequate pneumoperitoneum was obtained. A survey of the patient's pelvis and abdomen revealed the findings as mentioned above. Two 46mm ports were inserted in the lower left and right quadrants under visualization.    The bilateral fallopian tubes were separated from the mesosalpinx using the Ligasure. The bilateral round ligaments were transected and anterior broad ligament divided and brought across the uterus to separate the vesicouterine peritoneum and create a bladder flap. The bladder was pushed away from the uterus. The bilateral uterine arteries were skeletonized, ligated and transected. The bilateral uterosacral and cardinal ligaments were ligated and transected. A colpotomy was made around the V-Care cervical cup and the uterus, cervix, and bilateral tubes were removed through the vagina. The vaginal cuff was closed vaginally using 0-Vicryl in a running locking stitch. This was tested for integrity using the surgeon's finger.After return to the abdomina, the pneumoperitoneum was recreated and surgical site inspected, and found to be hemostatic. Bilateral ureters were visualized vermiuclating. No intraoperative injury to surrounding organs was noted. The abdomen was desufflated and all instruments were then removed.   All skin incisions were closed with 4-0 monocryl and covered with surgical glue. The patient tolerated the procedures well.  All instruments, needles, and sponge counts were correct x 2. The patient was taken to the recovery room in stable condition.   ---- Ranae Plumber, MD Attending Obstetrician and Gynecologist [facility]

## 2020-02-03 ENCOUNTER — Encounter: Payer: Self-pay | Admitting: Obstetrics & Gynecology

## 2020-02-03 NOTE — Anesthesia Postprocedure Evaluation (Signed)
Anesthesia Post Note  Patient: Maria Zhang  Procedure(s) Performed: TOTAL LAPAROSCOPIC HYSTERECTOMY WITH BILATERAL SALPINGECTOMY (Bilateral )  Patient location during evaluation: PACU Anesthesia Type: General Level of consciousness: awake and alert Pain management: pain level controlled Vital Signs Assessment: post-procedure vital signs reviewed and stable Respiratory status: spontaneous breathing, nonlabored ventilation and respiratory function stable Cardiovascular status: blood pressure returned to baseline and stable Postop Assessment: no apparent nausea or vomiting Anesthetic complications: no   No complications documented.   Last Vitals:  Vitals:   02/02/20 1515 02/02/20 1718  BP: 128/68 (!) 111/57  Pulse: 66 78  Resp:    Temp:    SpO2: 100% 100%    Last Pain:  Vitals:   02/03/20 0952  TempSrc:   PainSc: 0-No pain                 Karleen Hampshire

## 2020-02-04 LAB — SURGICAL PATHOLOGY

## 2021-05-09 ENCOUNTER — Other Ambulatory Visit: Payer: Self-pay | Admitting: Family Medicine

## 2021-05-12 ENCOUNTER — Other Ambulatory Visit: Payer: Self-pay | Admitting: Family Medicine

## 2021-05-12 DIAGNOSIS — N63 Unspecified lump in unspecified breast: Secondary | ICD-10-CM

## 2021-05-12 DIAGNOSIS — N644 Mastodynia: Secondary | ICD-10-CM

## 2021-05-30 ENCOUNTER — Other Ambulatory Visit: Payer: No Typology Code available for payment source

## 2021-06-06 ENCOUNTER — Ambulatory Visit
Admission: RE | Admit: 2021-06-06 | Discharge: 2021-06-06 | Disposition: A | Discharge status: Home / Self Care | Payer: Managed Care, Other (non HMO) | Source: Ambulatory Visit | Attending: Family Medicine | Admitting: Family Medicine

## 2021-06-06 DIAGNOSIS — N63 Unspecified lump in unspecified breast: Secondary | ICD-10-CM | POA: Insufficient documentation

## 2021-06-06 DIAGNOSIS — N644 Mastodynia: Secondary | ICD-10-CM | POA: Insufficient documentation

## 2021-10-02 ENCOUNTER — Emergency Department: Payer: Medicaid Other | Admitting: Anesthesiology

## 2021-10-02 ENCOUNTER — Encounter
Admission: EM | Disposition: A | Discharge status: Home / Self Care | Payer: Self-pay | Source: Home / Self Care | Attending: Emergency Medicine

## 2021-10-02 ENCOUNTER — Other Ambulatory Visit: Payer: Self-pay

## 2021-10-02 ENCOUNTER — Ambulatory Visit
Admission: EM | Admit: 2021-10-02 | Discharge: 2021-10-02 | Disposition: A | Discharge status: Home / Self Care | Payer: Medicaid Other | Attending: Emergency Medicine | Admitting: Emergency Medicine

## 2021-10-02 ENCOUNTER — Emergency Department: Payer: Medicaid Other

## 2021-10-02 DIAGNOSIS — F17210 Nicotine dependence, cigarettes, uncomplicated: Secondary | ICD-10-CM | POA: Diagnosis not present

## 2021-10-02 DIAGNOSIS — E669 Obesity, unspecified: Secondary | ICD-10-CM | POA: Diagnosis not present

## 2021-10-02 DIAGNOSIS — N83519 Torsion of ovary and ovarian pedicle, unspecified side: Secondary | ICD-10-CM

## 2021-10-02 DIAGNOSIS — N83512 Torsion of left ovary and ovarian pedicle: Secondary | ICD-10-CM | POA: Insufficient documentation

## 2021-10-02 DIAGNOSIS — Z6828 Body mass index (BMI) 28.0-28.9, adult: Secondary | ICD-10-CM | POA: Insufficient documentation

## 2021-10-02 HISTORY — PX: DIAGNOSTIC LAPAROSCOPY WITH REMOVAL OF ECTOPIC PREGNANCY: SHX6449

## 2021-10-02 LAB — CBC WITH DIFFERENTIAL/PLATELET
Abs Immature Granulocytes: 0.03 10*3/uL (ref 0.00–0.07)
Basophils Absolute: 0 10*3/uL (ref 0.0–0.1)
Basophils Relative: 0 %
Eosinophils Absolute: 0 10*3/uL (ref 0.0–0.5)
Eosinophils Relative: 0 %
HCT: 39.6 % (ref 36.0–46.0)
Hemoglobin: 13.5 g/dL (ref 12.0–15.0)
Immature Granulocytes: 0 %
Lymphocytes Relative: 19 %
Lymphs Abs: 1.9 10*3/uL (ref 0.7–4.0)
MCH: 31 pg (ref 26.0–34.0)
MCHC: 34.1 g/dL (ref 30.0–36.0)
MCV: 91 fL (ref 80.0–100.0)
Monocytes Absolute: 0.8 10*3/uL (ref 0.1–1.0)
Monocytes Relative: 8 %
Neutro Abs: 7.2 10*3/uL (ref 1.7–7.7)
Neutrophils Relative %: 73 %
Platelets: 144 10*3/uL — ABNORMAL LOW (ref 150–400)
RBC: 4.35 MIL/uL (ref 3.87–5.11)
RDW: 13.4 % (ref 11.5–15.5)
WBC: 10 10*3/uL (ref 4.0–10.5)
nRBC: 0 % (ref 0.0–0.2)

## 2021-10-02 LAB — URINALYSIS, ROUTINE W REFLEX MICROSCOPIC
Bilirubin Urine: NEGATIVE
Glucose, UA: NEGATIVE mg/dL
Ketones, ur: 20 mg/dL — AB
Leukocytes,Ua: NEGATIVE
Nitrite: NEGATIVE
Protein, ur: NEGATIVE mg/dL
Specific Gravity, Urine: 1.014 (ref 1.005–1.030)
pH: 5 (ref 5.0–8.0)

## 2021-10-02 LAB — TYPE AND SCREEN
ABO/RH(D): A POS
Antibody Screen: NEGATIVE

## 2021-10-02 LAB — COMPREHENSIVE METABOLIC PANEL
ALT: 12 U/L (ref 0–44)
AST: 18 U/L (ref 15–41)
Albumin: 4.1 g/dL (ref 3.5–5.0)
Alkaline Phosphatase: 48 U/L (ref 38–126)
Anion gap: 11 (ref 5–15)
BUN: 10 mg/dL (ref 6–20)
CO2: 20 mmol/L — ABNORMAL LOW (ref 22–32)
Calcium: 8.9 mg/dL (ref 8.9–10.3)
Chloride: 105 mmol/L (ref 98–111)
Creatinine, Ser: 0.58 mg/dL (ref 0.44–1.00)
GFR, Estimated: >60 mL/min (ref >60)
Glucose, Bld: 91 mg/dL (ref 70–99)
Potassium: 3.6 mmol/L (ref 3.5–5.1)
Sodium: 136 mmol/L (ref 135–145)
Total Bilirubin: 0.9 mg/dL (ref 0.3–1.2)
Total Protein: 6.9 g/dL (ref 6.5–8.1)

## 2021-10-02 SURGERY — LAPAROSCOPY, WITH ECTOPIC PREGNANCY SURGICAL TREATMENT
Anesthesia: General | Site: Abdomen

## 2021-10-02 MED ORDER — PROMETHAZINE HCL 25 MG/ML IJ SOLN: 6.2500 mg | INTRAMUSCULAR | Status: DC | PRN

## 2021-10-02 MED ORDER — ACETAMINOPHEN 10 MG/ML IV SOLN
INTRAVENOUS | Status: AC
  Filled 2021-10-02: qty 100

## 2021-10-02 MED ORDER — BUPIVACAINE HCL (PF) 0.5 % IJ SOLN
INTRAMUSCULAR | Status: AC
  Filled 2021-10-02: qty 30

## 2021-10-02 MED ORDER — MIDAZOLAM HCL 2 MG/2ML IJ SOLN
INTRAMUSCULAR | Status: DC | PRN
  Administered 2021-10-02: 2 mg via INTRAVENOUS

## 2021-10-02 MED ORDER — FENTANYL CITRATE (PF) 100 MCG/2ML IJ SOLN
INTRAMUSCULAR | Status: AC
  Filled 2021-10-02: qty 2

## 2021-10-02 MED ORDER — LACTATED RINGERS IV SOLN: INTRAVENOUS | Status: DC

## 2021-10-02 MED ORDER — IBUPROFEN 800 MG PO TABS: 800.0000 mg | ORAL_TABLET | Freq: Three times a day (TID) | ORAL | 1 refills | Status: AC

## 2021-10-02 MED ORDER — ACETAMINOPHEN EXTRA STRENGTH 500 MG PO TABS: 1000.0000 mg | ORAL_TABLET | Freq: Four times a day (QID) | ORAL | 0 refills | Status: AC

## 2021-10-02 MED ORDER — MIDAZOLAM HCL 2 MG/2ML IJ SOLN
INTRAMUSCULAR | Status: AC
  Filled 2021-10-02: qty 2

## 2021-10-02 MED ORDER — OXYCODONE HCL 5 MG PO TABS: 5.0000 mg | ORAL_TABLET | ORAL | 0 refills | Status: AC | PRN

## 2021-10-02 MED ORDER — OXYCODONE HCL 5 MG/5ML PO SOLN: 5.0000 mg | Freq: Once | ORAL | Status: AC | PRN

## 2021-10-02 MED ORDER — MORPHINE SULFATE (PF) 4 MG/ML IV SOLN
4.0000 mg | Freq: Once | INTRAVENOUS | Status: AC
  Administered 2021-10-02: 4 mg via INTRAVENOUS
  Filled 2021-10-02: qty 1

## 2021-10-02 MED ORDER — SUGAMMADEX SODIUM 200 MG/2ML IV SOLN
INTRAVENOUS | Status: DC | PRN
  Administered 2021-10-02: 240 mg via INTRAVENOUS

## 2021-10-02 MED ORDER — OXYCODONE HCL 5 MG PO TABS
5.0000 mg | ORAL_TABLET | Freq: Once | ORAL | Status: AC | PRN
  Administered 2021-10-02: 5 mg via ORAL

## 2021-10-02 MED ORDER — LIDOCAINE HCL (CARDIAC) PF 100 MG/5ML IV SOSY
PREFILLED_SYRINGE | INTRAVENOUS | Status: DC | PRN
  Administered 2021-10-02: 100 mg via INTRAVENOUS

## 2021-10-02 MED ORDER — OXYCODONE HCL 5 MG PO TABS
ORAL_TABLET | ORAL | Status: AC
  Filled 2021-10-02: qty 1

## 2021-10-02 MED ORDER — ACETAMINOPHEN 10 MG/ML IV SOLN
1000.0000 mg | Freq: Once | INTRAVENOUS | Status: DC | PRN
  Administered 2021-10-02: 1000 mg via INTRAVENOUS

## 2021-10-02 MED ORDER — 0.9 % SODIUM CHLORIDE (POUR BTL) OPTIME
TOPICAL | Status: DC | PRN
  Administered 2021-10-02: 10 mL

## 2021-10-02 MED ORDER — SUCCINYLCHOLINE CHLORIDE 200 MG/10ML IV SOSY
PREFILLED_SYRINGE | INTRAVENOUS | Status: DC | PRN
  Administered 2021-10-02: 100 mg via INTRAVENOUS

## 2021-10-02 MED ORDER — DEXAMETHASONE SODIUM PHOSPHATE 10 MG/ML IJ SOLN
INTRAMUSCULAR | Status: DC | PRN
  Administered 2021-10-02: 10 mg via INTRAVENOUS

## 2021-10-02 MED ORDER — LACTATED RINGERS IV SOLN: INTRAVENOUS | Status: DC | PRN

## 2021-10-02 MED ORDER — DROPERIDOL 2.5 MG/ML IJ SOLN: 0.6250 mg | Freq: Once | INTRAMUSCULAR | Status: DC | PRN

## 2021-10-02 MED ORDER — PROPOFOL 10 MG/ML IV BOLUS
INTRAVENOUS | Status: AC
  Filled 2021-10-02: qty 20

## 2021-10-02 MED ORDER — KETOROLAC TROMETHAMINE 30 MG/ML IJ SOLN
30.0000 mg | Freq: Once | INTRAMUSCULAR | Status: AC
  Administered 2021-10-02: 30 mg via INTRAVENOUS

## 2021-10-02 MED ORDER — ONDANSETRON HCL 4 MG/2ML IJ SOLN
INTRAMUSCULAR | Status: DC | PRN
  Administered 2021-10-02: 4 mg via INTRAVENOUS

## 2021-10-02 MED ORDER — PROPOFOL 10 MG/ML IV BOLUS
INTRAVENOUS | Status: DC | PRN
  Administered 2021-10-02: 150 mg via INTRAVENOUS

## 2021-10-02 MED ORDER — FENTANYL CITRATE (PF) 100 MCG/2ML IJ SOLN
INTRAMUSCULAR | Status: DC | PRN
  Administered 2021-10-02 (×2): 50 ug via INTRAVENOUS

## 2021-10-02 MED ORDER — KETOROLAC TROMETHAMINE 30 MG/ML IJ SOLN
INTRAMUSCULAR | Status: AC
  Filled 2021-10-02: qty 1

## 2021-10-02 MED ORDER — FENTANYL CITRATE (PF) 100 MCG/2ML IJ SOLN
25.0000 ug | INTRAMUSCULAR | Status: DC | PRN
  Administered 2021-10-02 (×4): 25 ug via INTRAVENOUS

## 2021-10-02 MED ORDER — BUPIVACAINE HCL 0.5 % IJ SOLN
INTRAMUSCULAR | Status: DC | PRN
  Administered 2021-10-02: 5 mL

## 2021-10-02 MED ORDER — LIDOCAINE HCL (PF) 2 % IJ SOLN
INTRAMUSCULAR | Status: AC
  Filled 2021-10-02: qty 5

## 2021-10-02 MED ORDER — POVIDONE-IODINE 10 % EX SWAB: 2.0000 | Freq: Once | CUTANEOUS | Status: DC

## 2021-10-02 MED ORDER — DEXMEDETOMIDINE (PRECEDEX) IN NS 20 MCG/5ML (4 MCG/ML) IV SYRINGE
PREFILLED_SYRINGE | INTRAVENOUS | Status: DC | PRN
  Administered 2021-10-02: 8 ug via INTRAVENOUS

## 2021-10-02 MED ORDER — DOCUSATE SODIUM 100 MG PO CAPS: 100.0000 mg | ORAL_CAPSULE | Freq: Two times a day (BID) | ORAL | 0 refills | Status: AC

## 2021-10-02 MED ORDER — GABAPENTIN 300 MG PO CAPS: 300.0000 mg | ORAL_CAPSULE | Freq: Every day | ORAL | 0 refills | Status: AC

## 2021-10-02 MED ORDER — ROCURONIUM BROMIDE 10 MG/ML (PF) SYRINGE
PREFILLED_SYRINGE | INTRAVENOUS | Status: AC
  Filled 2021-10-02: qty 10

## 2021-10-02 MED ORDER — ROCURONIUM BROMIDE 100 MG/10ML IV SOLN
INTRAVENOUS | Status: DC | PRN
  Administered 2021-10-02: 40 mg via INTRAVENOUS

## 2021-10-02 SURGICAL SUPPLY — 57 items
APPLICATOR ARISTA FLEXITIP XL (MISCELLANEOUS) ×2 IMPLANT
BAG URINE DRAIN 2000ML AR STRL (UROLOGICAL SUPPLIES) ×3 IMPLANT
BLADE SURG SZ11 CARB STEEL (BLADE) ×3 IMPLANT
CATH FOLEY 2WAY  5CC 16FR (CATHETERS) ×3
CATH URTH 16FR FL 2W BLN LF (CATHETERS) ×2 IMPLANT
CHLORAPREP W/TINT 26 (MISCELLANEOUS) ×3 IMPLANT
CORD MONOPOLAR M/FML 12FT (MISCELLANEOUS) ×3 IMPLANT
DERMABOND ADVANCED (GAUZE/BANDAGES/DRESSINGS) ×1
DERMABOND ADVANCED .7 DNX12 (GAUZE/BANDAGES/DRESSINGS) ×2 IMPLANT
DRAPE STERI POUCH LG 24X46 STR (DRAPES) IMPLANT
GAUZE 4X4 16PLY ~~LOC~~+RFID DBL (SPONGE) ×3 IMPLANT
GLOVE BIO SURGEON STRL SZ7 (GLOVE) ×6 IMPLANT
GLOVE SURG SYN 8.0 (GLOVE) ×3 IMPLANT
GLOVE SURG SYN 8.0 PF PI (GLOVE) ×2 IMPLANT
GLOVE SURG UNDER LTX SZ7.5 (GLOVE) ×3 IMPLANT
GOWN STRL REUS W/ TWL LRG LVL3 (GOWN DISPOSABLE) ×4 IMPLANT
GOWN STRL REUS W/ TWL XL LVL3 (GOWN DISPOSABLE) ×2 IMPLANT
GOWN STRL REUS W/TWL LRG LVL3 (GOWN DISPOSABLE) ×12
GOWN STRL REUS W/TWL XL LVL3 (GOWN DISPOSABLE) ×3
GRASPER SUT TROCAR 14GX15 (MISCELLANEOUS) ×1 IMPLANT
HEMOSTAT ARISTA ABSORB 3G PWDR (HEMOSTASIS) IMPLANT
IRRIGATION STRYKERFLOW (MISCELLANEOUS) IMPLANT
IRRIGATOR STRYKERFLOW (MISCELLANEOUS)
IV NS 1000ML (IV SOLUTION) ×3
IV NS 1000ML BAXH (IV SOLUTION) ×2 IMPLANT
KIT PINK PAD W/HEAD ARE REST (MISCELLANEOUS) ×3 IMPLANT
KIT PINK PAD W/HEAD ARM REST (MISCELLANEOUS) ×2 IMPLANT
KIT TURNOVER CYSTO (KITS) ×3 IMPLANT
L-HOOK LAP DISP 36CM (ELECTROSURGICAL)
LABEL OR SOLS (LABEL) ×3 IMPLANT
LHOOK LAP DISP 36CM (ELECTROSURGICAL) IMPLANT
LIGASURE LAP MARYLAND 5MM 37CM (ELECTROSURGICAL) ×1 IMPLANT
LIGASURE VESSEL 5MM BLUNT TIP (ELECTROSURGICAL) IMPLANT
MANIFOLD NEPTUNE II (INSTRUMENTS) ×3 IMPLANT
NS IRRIG 500ML POUR BTL (IV SOLUTION) ×3 IMPLANT
PACK GYN LAPAROSCOPIC (MISCELLANEOUS) ×3 IMPLANT
PAD OB MATERNITY 4.3X12.25 (PERSONAL CARE ITEMS) ×3 IMPLANT
PAD PREP 24X41 OB/GYN DISP (PERSONAL CARE ITEMS) ×3 IMPLANT
SCISSORS METZENBAUM CVD 33 (INSTRUMENTS) IMPLANT
SCRUB CHG 4% DYNA-HEX 4OZ (MISCELLANEOUS) ×3 IMPLANT
SET TUBE SMOKE EVAC HIGH FLOW (TUBING) ×3 IMPLANT
SLEEVE ENDOPATH XCEL 5M (ENDOMECHANICALS) ×3 IMPLANT
STRIP CLOSURE SKIN 1/4X4 (GAUZE/BANDAGES/DRESSINGS) IMPLANT
SUT MNCRL AB 4-0 PS2 18 (SUTURE) ×3 IMPLANT
SUT VIC AB 2-0 UR6 27 (SUTURE) ×3 IMPLANT
SUT VIC AB 4-0 SH 27 (SUTURE)
SUT VIC AB 4-0 SH 27XANBCTRL (SUTURE) ×2 IMPLANT
SUT VICRYL 0 AB UR-6 (SUTURE) ×1 IMPLANT
SYR 50ML LL SCALE MARK (SYRINGE) IMPLANT
SYR 5ML LL (SYRINGE) IMPLANT
SYS BAG RETRIEVAL 10MM (BASKET) ×3
SYSTEM BAG RETRIEVAL 10MM (BASKET) IMPLANT
TRAP FLUID SMOKE EVACUATOR (MISCELLANEOUS) ×3 IMPLANT
TROCAR XCEL NON-BLD 11X100MML (ENDOMECHANICALS) ×1 IMPLANT
TROCAR XCEL NON-BLD 5MMX100MML (ENDOMECHANICALS) ×3 IMPLANT
TUBING ART PRESS 48 MALE/FEM (TUBING) IMPLANT
WATER STERILE IRR 500ML POUR (IV SOLUTION) ×3 IMPLANT

## 2021-10-02 NOTE — Discharge Instructions (Addendum)
Laparoscopic Ovarian Surgery Discharge Instructions  For the next three days, take ibuprofen and acetaminophen on a schedule, every 8 hours. You can take them together or you can intersperse them, and take one every four hours. I also gave you gabapentin for nighttime, to help you sleep and also to control pain. Take gabapentin medicines at night for at least the next 3 nights. You also have a narcotic, oxycodone, to take as needed if the above medicines don't help.  Postop constipation is a major cause of pain. Stay well hydrated, walk as you tolerate, and take over the counter senna as well as stool softeners if you need them.   RISKS AND COMPLICATIONS  Infection. Bleeding. Injury to surrounding organs. Anesthetic side effects.   PROCEDURE  You may be given a medicine to help you relax (sedative) before the procedure. You will be given a medicine to make you sleep (general anesthetic) during the procedure. A tube will be put down your throat to help your breath while under general anesthesia. Several small cuts (incisions) are made in the lower abdominal area and one incision is made near the belly button. Your abdominal area will be inflated with a safe gas (carbon dioxide). This helps give the surgeon room to operate, visualize, and helps the surgeon avoid other organs. A thin, lighted tube (laparoscope) with a camera attached is inserted into your abdomen through the incision near the belly button. Other small instruments may also be inserted through other abdominal incisions. The ovary is located and are removed. After the ovary is removed, the gas is released from the abdomen. The incisions will be closed with stitches (sutures), and Dermabond. A bandage may be placed over the incisions.  AFTER THE PROCEDURE  You will also have some mild abdominal discomfort for 3-7 days. You will be given pain medicine to ease any discomfort. As long as there are no problems, you may be allowed to  go home. Someone will need to drive you home and be with you for at least 24 hours once home. You may have some mild discomfort in the throat. This is from the tube placed in your throat while you were sleeping. You may experience discomfort in the shoulder area from some trapped air between the liver and diaphragm. This sensation is normal and will slowly go away on its own.  HOME CARE INSTRUCTIONS  Take all medicines as directed. Only take over-the-counter or prescription medicines for pain, discomfort, or fever as directed by your caregiver. Resume daily activities as directed. Showers are preferred over baths for 2 weeks. You may resume sexual activities in 1 week or as you feel you would like to. Do not drive while taking narcotics.  SEEK MEDICAL CARE IF: . There is increasing abdominal pain. You feel lightheaded or faint. You have the chills. You have an oral temperature above 102 F (38.9 C). There is pus-like (purulent) drainage from any of the wounds. You are unable to pass gas or have a bowel movement. You feel sick to your stomach (nauseous) or throw up (vomit) and can't control it with your medicines.  MAKE SURE YOU:  Understand these instructions. Will watch your condition. Will get help right away if you are not doing well or get worse.  [employer] Patient Information 2013 [employer]    AMBULATORY SURGERY  DISCHARGE INSTRUCTIONS   The drugs that you were given will stay in your system until tomorrow so for the next 24 hours you should not:  Drive   an automobile Make any legal decisions Drink any alcoholic beverage   You may resume regular meals tomorrow.  Today it is better to start with liquids and gradually work up to solid foods.  You may eat anything you prefer, but it is better to start with liquids, then soup and crackers, and gradually work up to solid foods.   Please notify your doctor immediately if you have any unusual bleeding, trouble  breathing, redness and pain at the surgery site, drainage, fever, or pain not relieved by medication.    Your post-operative visit with Dr.                                       is: Date:                        Time:    Please call to schedule your post-operative visit.  Additional Instructions: 

## 2021-10-02 NOTE — ED Notes (Signed)
Report given to OR.

## 2021-10-02 NOTE — ED Triage Notes (Signed)
Pt in with co llq pain x 2 days, no radiation. Pt states worsened today, has been vomiting, no diarrhea. No fever or dysuria.

## 2021-10-02 NOTE — Anesthesia Preprocedure Evaluation (Addendum)
Anesthesia Evaluation  Patient identified by MRN, date of birth, ID band Patient awake    Reviewed: Allergy & Precautions, H&P , NPO status , Patient's Chart, lab work & pertinent test results, reviewed documented beta blocker date and time   Airway Mallampati: II  TM Distance: >3 FB Neck ROM: full    Dental  (+) Teeth Intact   Pulmonary Current Smoker and Patient abstained from smoking.,    Pulmonary exam normal        Cardiovascular Exercise Tolerance: Good negative cardio ROS Normal cardiovascular exam Rhythm:regular Rate:Normal     Neuro/Psych negative neurological ROS  negative psych ROS   GI/Hepatic negative GI ROS, Neg liver ROS,   Endo/Other  negative endocrine ROS  Renal/GU negative Renal ROS  negative genitourinary   Musculoskeletal   Abdominal (+) + obese,   Peds  Hematology negative hematology ROS (+)   Anesthesia Other Findings Ovarian torsion  Past Surgical History: No date: DILATION AND CURETTAGE OF UTERUS No date: ESSURE TUBAL LIGATION No date: TUBAL LIGATION BMI    Body Mass Index: 28.63 kg/m     Reproductive/Obstetrics negative OB ROS                            Anesthesia Physical  Anesthesia Plan  ASA: II  Anesthesia Plan: General ETT   Post-op Pain Management: Dilaudid IV, Toradol IV (intra-op)* and Ofirmev IV (intra-op)*   Induction: Intravenous and Rapid sequence  PONV Risk Score and Plan: 3 and Ondansetron, Dexamethasone and Midazolam  Airway Management Planned: Oral ETT  Additional Equipment:   Intra-op Plan:   Post-operative Plan: Extubation in OR  Informed Consent: I have reviewed the patients History and Physical, chart, labs and discussed the procedure including the risks, benefits and alternatives for the proposed anesthesia with the patient or authorized representative who has indicated his/her understanding and acceptance.     Dental  Advisory Given  Plan Discussed with: CRNA  Anesthesia Plan Comments:        Anesthesia Quick Evaluation

## 2021-10-02 NOTE — H&P (Addendum)
Consult History and Physical   SERVICE: Gynecology   Patient Name: Maria Zhang Patient MRN:   [id number]  CC: LEFT pelvic pain  HPI: Maria Zhang is a 36 y.o. F with hx of lap hysterectomy for menorrhagia in 2021 presenting with 15 hours of severe L pelvic pain, suspicious for ovarian torsion (LEFT).    NPO since this morning with nausea. Pain has improved with iv morphine. Still abdominal tenderness, L>R   Review of Systems: positives in bold GEN:   fevers, chills, weight changes, appetite changes, fatigue, night sweats HEENT:  HA, vision changes, hearing loss, congestion, rhinorrhea, sinus pressure, dysphagia CV:   CP, palpitations PULM:  SOB, cough GI:  abd pain, N/V/D/C GU:  dysuria, urgency, frequency MSK:  arthralgias, myalgias, back pain, swelling SKIN:  rashes, color changes, pallor NEURO:  numbness, weakness, tingling, seizures, dizziness, tremors PSYCH:  depression, anxiety, behavioral problems, confusion  HEME/LYMPH:  easy bruising or bleeding ENDO:  heat/cold intolerance  Past Obstetrical History: OB History   No obstetric history on file.     Past Gynecologic History: Patient's last menstrual period was 01/23/2020 (approximate). S/p hysterectomy  Past Medical History: No past medical history on file.  Past Surgical History:   Past Surgical History:  Procedure Laterality Date   DILATION AND CURETTAGE OF UTERUS     ESSURE TUBAL LIGATION     TOTAL LAPAROSCOPIC HYSTERECTOMY WITH SALPINGECTOMY Bilateral 02/02/2020   Procedure: TOTAL LAPAROSCOPIC HYSTERECTOMY WITH BILATERAL SALPINGECTOMY;  Surgeon: Ward, Honor Loh, MD;  Location: [facility] ORS;  Service: Gynecology;  Laterality: Bilateral;   TUBAL LIGATION      Family History:  family history is not on file.  Social History:  Social History   Socioeconomic History   Marital status: Single    Spouse name: Not on file   Number of children: Not on file   Years of education: Not on file   Highest  education level: Not on file  Occupational History   Not on file  Tobacco Use   Smoking status: Every Day    Packs/day: 0.50    Types: Cigarettes   Smokeless tobacco: Never  Vaping Use   Vaping Use: Never used  Substance and Sexual Activity   Alcohol use: No    Comment: occ   Drug use: No   Sexual activity: Not on file  Other Topics Concern   Not on file  Social History Narrative   Not on file   Social Determinants of Health   Financial Resource Strain: Not on file  Food Insecurity: Not on file  Transportation Needs: Not on file  Physical Activity: Not on file  Stress: Not on file  Social Connections: Not on file  Intimate Partner Violence: Not on file    Home Medications:  Medications reconciled in EPIC  No current facility-administered medications on file prior to encounter.   Current Outpatient Medications on File Prior to Encounter  Medication Sig Dispense Refill   Biotin 1 MG CAPS Take 1,000 mcg by mouth in the morning and at bedtime.     ibuprofen (ADVIL) 800 MG tablet Take 1 tablet (800 mg total) by mouth every 6 (six) hours. 45 tablet 0   naproxen (NAPROSYN) 500 MG tablet Take 500 mg by mouth daily as needed for mild pain or moderate pain.      Allergies:  No Known Allergies  Physical Exam:  Temp:  [98.2 F (36.8 C)-98.5 F (36.9 C)] 98.2 F (36.8 C) (08/14 1838) Pulse Rate:  [  79-84] 84 (08/14 1838) Resp:  [18-20] 18 (08/14 1838) BP: (112-133)/(79-106) 133/106 (08/14 1838) SpO2:  [98 %-100 %] 98 % (08/14 1838) Weight:  [57 kg] 63 kg (08/14 1528)   General Appearance:  Well developed, well nourished, no acute distress, alert and oriented x3 HEENT:  Normocephalic atraumatic, extraocular movements intact, moist mucous membranes Cardiovascular:  regular rate and rhythm, no murmurs Pulmonary:  clear to auscultation,  Abdomen:  Bowel sounds present, soft, minimally tender, nondistended, no abnormal masses, no epigastric pain Extremities:  Full range of  motion, no pedal edema, 2+ distal pulses, no tenderness Skin:  normal coloration and turgor, no rashes, no suspicious skin lesions noted  Neurologic:  Cranial nerves 2-12 grossly intact, Psychiatric:  Normal mood and affect, appropriate, no AH/VH Pelvic:  deferred   Labs/Studies:   CBC and Coags:  Lab Results  Component Value Date   WBC 10.0 10/02/2021   NEUTOPHILPCT 73 10/02/2021   EOSPCT 0 10/02/2021   BASOPCT 0 10/02/2021   LYMPHOPCT 19 10/02/2021   HGB 13.5 10/02/2021   HCT 39.6 10/02/2021   MCV 91.0 10/02/2021   PLT 144 (L) 10/02/2021   CMP:  Lab Results  Component Value Date   NA 136 10/02/2021   K 3.6 10/02/2021   CL 105 10/02/2021   CO2 20 (L) 10/02/2021   BUN 10 10/02/2021   CREATININE 0.58 10/02/2021   CREATININE 0.63 01/29/2020   CREATININE 0.80 08/25/2013   PROT 6.9 10/02/2021   BILITOT 0.9 10/02/2021   ALT 12 10/02/2021   AST 18 10/02/2021   ALKPHOS 48 10/02/2021   Other Labs: Other Imaging: US PELVIC COMPLETE W TRANSVAGINAL AND TORSION R/O  Result Date: 10/02/2021 CLINICAL DATA:  LEFT lower quadrant pain for 2 days, post hysterectomy EXAM: TRANSABDOMINAL AND TRANSVAGINAL ULTRASOUND OF PELVIS DOPPLER ULTRASOUND OF OVARIES TECHNIQUE: Both transabdominal and transvaginal ultrasound examinations of the pelvis were performed. Transabdominal technique was performed for global imaging of the pelvis including uterus, ovaries, adnexal regions, and pelvic cul-de-sac. It was necessary to proceed with endovaginal exam following the transabdominal exam to visualize the ovaries and adnexa. Color and duplex Doppler ultrasound was utilized to evaluate blood flow to the ovaries. COMPARISON:  None Available. FINDINGS: Uterus Surgically absent Endometrium Surgically absent Right ovary Measurements: 2.4 x 1.4 x 2.9 cm = volume: 5.2 mL. Normal morphology without mass. Internal blood flow present on color Doppler imaging. Left ovary Measurements: 5.1 x 3.3 x 4.1 cm = volume: 35.5  mL. Enlarged and heterogeneous versus RIGHT, appears edematous. Few scattered follicles. Some internal blood flow is present on color Doppler imaging. No dominant mass. Pulsed Doppler evaluation of both ovaries demonstrates low resistance arterial and venous waveforms in both ovaries. Other findings Small amount of free pelvic fluid in LEFT adnexa adjacent to LEFT ovary. No adnexal masses. IMPRESSION: Post hysterectomy with unremarkable RIGHT ovary. Enlarged, heterogeneous and somewhat edematous appearing LEFT ovary though some internal blood flow is present on color Doppler imaging. LEFT ovary does not appear normal by grayscale imaging; observed pattern of grayscale and vascular findings is most suggestive of either incomplete LEFT ovarian torsion or intermittent LEFT ovarian torsion/detorsion. Critical Value/emergent results were called by telephone at the time of interpretation on 10/02/2021 at 1706 hours to provider Dr. Larinda Buttery, who verbally acknowledged these results. Electronically Signed   By: Ulyses Southward M.D.   On: 10/02/2021 17:07     Assessment / Plan:   Maria Zhang is a 36 y.o. F who presents with 15 hours  of pelvic pain, with suspicion for ovarian torsion.   Consents signed today. Risks of dx lap with possible oophorectomy and salpingectomy and evacuation of hemoperitoneum if present were discussed with the patient including but not limited to: bleeding which may require transfusion; infection which may require antibiotics; injury to uterus or surrounding organs; intrauterine scarring which may impair future fertility if D&C is required; need for additional procedures including laparotomy or laparoscopy; and other postoperative/anesthesia complications. Written informed consent was obtained.  This is a scheduled same-day surgery. She will have a postop visit in 2 weeks to review operative findings and pathology.

## 2021-10-02 NOTE — ED Provider Triage Note (Signed)
Emergency Medicine Provider Triage Evaluation Note  Maria Zhang , a 36 y.o. female  was evaluated in triage.  Pt complains of LLQ pain, vomiting. No dysuria. No fever. No diarrhea. Reports pain gets intermittently worse and then gets better but doesn't resolve.   Review of Systems  Positive: LLQ pain, vomiting Negative: Diarrhea, fever, dysuria  Physical Exam  BP 112/79 (BP Location: Right Arm)   Pulse 79   Temp 98.5 F (36.9 C) (Oral)   Resp 20   LMP 01/23/2020 (Approximate)   SpO2 100%  Gen:   Awake, no distress. Holding abdomen bent over Resp:  Normal effort  MSK:   Moves extremities without difficulty  Other:    Medical Decision Making  Medically screening exam initiated at 3:28 PM.  Appropriate orders placed.  Primus Bravo was informed that the remainder of the evaluation will be completed by another provider, this initial triage assessment does not replace that evaluation, and the importance of remaining in the ED until their evaluation is complete.     Jackelyn Hoehn, PA-C 10/02/21 1531

## 2021-10-02 NOTE — Transfer of Care (Signed)
Immediate Anesthesia Transfer of Care Note  Patient: Maria Zhang  Procedure(s) Performed: DIAGNOSTIC LAPAROSCOPY FOR POSSIBLE OVARIAN TORSION (Abdomen) LAPAROSCOPIC OOPHORECTOMY (Left: Abdomen)  Patient Location: PACU  Anesthesia Type:General  Level of Consciousness: sedated  Airway & Oxygen Therapy: Patient Spontanous Breathing and Patient connected to face mask oxygen  Post-op Assessment: Report given to RN and Post -op Vital signs reviewed and stable  Post vital signs: Reviewed and stable  Last Vitals:  Vitals Value Taken Time  BP 108/82 10/02/21 2016  Temp    Pulse 76 10/02/21 2017  Resp 18 10/02/21 2017  SpO2 100 % 10/02/21 2017  Vitals shown include unvalidated device data.  Last Pain:  Vitals:   10/02/21 1838  TempSrc: Oral  PainSc: 5          Complications: No notable events documented.

## 2021-10-02 NOTE — ED Notes (Signed)
Pt given phone to call mother

## 2021-10-02 NOTE — ED Provider Notes (Signed)
[facility] Provider Note    Event Date/Time   First MD Initiated Contact with Patient 10/02/21 1723     (approximate)   History   Abdominal Pain   HPI  PAISLEI DORVAL is a 36 y.o. female G4 P3 status post hysterectomy presents with left lower quadrant abdominal pain.  Pain started 2 days ago.  She pain is located in left lower quadrant.  Initially was intermittent but since 4 AM has been rather constant sharp nonradiating pain has had several episodes of emesis and nausea.  Denies diarrhea last BM was today denies abnormal vaginal discharge or bleeding she is status post hysterectomy no fevers no history of similar pain.  No history of ovarian cyst.    No past medical history on file.  There are no problems to display for this patient.    Physical Exam  Triage Vital Signs: ED Triage Vitals  Enc Vitals Group     BP 10/02/21 1442 112/79     Pulse Rate 10/02/21 1442 79     Resp 10/02/21 1442 20     Temp 10/02/21 1442 98.5 F (36.9 C)     Temp Source 10/02/21 1442 Oral     SpO2 10/02/21 1442 100 %     Weight 10/02/21 1528 139 lb (63 kg)     Height 10/02/21 1528 4\' 7"  (1.397 m)     Head Circumference --      Peak Flow --      Pain Score 10/02/21 1528 9     Pain Loc --      Pain Edu? --      Excl. in GC? --     Most recent vital signs: Vitals:   10/02/21 1442 10/02/21 1838  BP: 112/79 (!) 133/106  Pulse: 79 84  Resp: 20 18  Temp: 98.5 F (36.9 C) 98.2 F (36.8 C)  SpO2: 100% 98%     General: Awake, no distress.  CV:  Good peripheral perfusion.  Resp:  Normal effort.  Abd:  No distention.  Tenderness to palpation in the left lower pelvis with no guarding, abdomen is soft Neuro:             Awake, Alert, Oriented x 3  Other:     ED Results / Procedures / Treatments  Labs (all labs ordered are listed, but only abnormal results are displayed) Labs Reviewed  URINALYSIS, ROUTINE W REFLEX MICROSCOPIC - Abnormal; Notable for the  following components:      Result Value   Color, Urine YELLOW (*)    APPearance CLEAR (*)    Hgb urine dipstick LARGE (*)    Ketones, ur 20 (*)    Bacteria, UA RARE (*)    All other components within normal limits  CBC WITH DIFFERENTIAL/PLATELET - Abnormal; Notable for the following components:   Platelets 144 (*)    All other components within normal limits  COMPREHENSIVE METABOLIC PANEL - Abnormal; Notable for the following components:   CO2 20 (*)    All other components within normal limits  TYPE AND SCREEN  TYPE AND SCREEN     EKG     RADIOLOGY   PROCEDURES:  Critical Care performed: No  Procedures  The patient is on the cardiac monitor to evaluate for evidence of arrhythmia and/or significant heart rate changes.   MEDICATIONS ORDERED IN ED: Medications  morphine (PF) 4 MG/ML injection 4 mg (4 mg Intravenous Given 10/02/21 1802)     IMPRESSION /  MDM / ASSESSMENT AND PLAN / ED COURSE  I reviewed the triage vital signs and the nursing notes.                              Patient's presentation is most consistent with acute presentation with potential threat to life or bodily function.  Differential diagnosis includes, but is not limited to, diverticulitis, ovarian torsion, ovarian cyst, PID, Pilo, kidney stone  Patient is a 36 year old female status post hysterectomy presents with left lower quadrant abdominal pain.  Has been intermittent over the last 2 days but constant since 4 AM today.  Pain is located left lower quadrant associated with vomiting.  Patient's vital signs are within normal limits overall she looks well but uncomfortable she is tender in the left pelvic region.  Pelvic ultrasound ordered from triage is concerning for possible incomplete torsion with heterogenous appearing edematous left ovary there is still some blood flow.  Will place IV give IV morphine and make n.p.o.  I discussed with CNM with [facility] clinic.  Patient seen by Dr. Dalbert Garnet  who will take the patient for diagnostic laparoscopy.    FINAL CLINICAL IMPRESSION(S) / ED DIAGNOSES   Final diagnoses:  Ovarian torsion     Rx / DC Orders   ED Discharge Orders     None        Note:  This document was prepared using Dragon voice recognition software and may include unintentional dictation errors.   Georga Hacking, MD 10/02/21 1859

## 2021-10-02 NOTE — Progress Notes (Signed)
TC from ER physician Dr Sidney Ace, [phone] with LLQ pain that became severe this morning; pelvic US with left ovary edematous and possible torsion.    IMPRESSION: Post hysterectomy with unremarkable RIGHT ovary.   Enlarged, heterogeneous and somewhat edematous appearing LEFT ovary though some internal blood flow is present on color Doppler imaging.   LEFT ovary does not appear normal by grayscale imaging; observed pattern of grayscale and vascular findings is most suggestive of either incomplete LEFT ovarian torsion or intermittent LEFT ovarian torsion/detorsion.  Called Dr Dalbert Garnet, notified of [place] results, prior pt Gyn history.   Randa Ngo, CNM 10/02/2021 5:50 PM

## 2021-10-02 NOTE — ED Notes (Signed)
Pt belongings given to Drema Pry, [address]

## 2021-10-02 NOTE — Op Note (Signed)
[facility] PROCEDURE DATE: 10/02/2021  INDICATIONS: Acute pelvic pain  PREOPERATIVE DIAGNOSIS: Suspected ovarian torsion POSTOPERATIVE DIAGNOSIS: Ovarian torsion  PROCEDURE: Exam under anesthesia, diagnostic laparoscopy, left oophorectomy SURGEON:  Dr. Christeen Douglas ASSISTANT: CST ANESTHESIOLOGIST: Foye Deer, MD Anesthesiologist: Foye Deer, MD CRNA: Waldo Laine, CRNA; Elmarie Mainland, CRNA  INDICATIONS: 36 y.o. F with history of acute-onset pelvic pain desiring surgical evaluation.  hx of lap hysterectomy for menorrhagia in 2021 presenting with 15 hours of severe L pelvic pain, suspicious for ovarian torsion (LEFT).    Please see preoperative notes for further details.  Risks of surgery were discussed with the patient including but not limited to: bleeding which may require transfusion or reoperation; infection which may require antibiotics; injury to bowel, bladder, ureters or other surrounding organs; need for additional procedures including laparotomy; thromboembolic phenomenon, incisional problems and other postoperative/anesthesia complications. Written informed consent was obtained.    FINDINGS:  Absent uterus, normal right ovary and fallopian tube. Black and edematous left ovary, with torsion noted for 6 twists. No other abdominal/pelvic abnormality.  Normal upper abdomen. Minimal hemoperitoneum  ANESTHESIA:    General INTRAVENOUS FLUIDS: 600 ml ESTIMATED BLOOD LOSS: 0 ml HEMOPERITONEUM: 40ml URINE OUTPUT: 200 ml SPECIMENS: left ovarian COMPLICATIONS: None immediate  PROCEDURE IN DETAIL:  The patient had sequential compression devices applied to her lower extremities while in the preoperative area.  She was then taken to the operating room where general anesthesia was administered and was found to be adequate.  She was placed in the dorsal lithotomy position, and was prepped and draped in a sterile manner.  A Foley catheter was inserted into her  bladder and attached to constant drainage and a sponge stick placed in the vagina.  After an adequate timeout was performed, attention was turned to the abdomen where an umbilical incision was made with the scalpel.  The Optiview 5-mm trocar and sleeve were then advanced without difficulty with the laparoscope under direct visualization into the abdomen.  The abdomen was then insufflated with carbon dioxide gas and adequate pneumoperitoneum was obtained.   A detailed survey of the patient's pelvis and abdomen revealed the findings as mentioned above. Two additional 86mm trocars were placed in the bilateral lower quadrants under direct visualization.  The left ovary was untwisted without any change in color, and dark clots extruded. It did not change appearance, and the decision to remove this ovary was made. We had discussed this prior to the surgery and patient was agreeable.   The left ovarian vessels were skeletonized and cauterized. The ovarian adhesions were removed. The 93mm port in the LLQ was replaced with a 78mm port and the specimen placed in a bag. Removed intact.  The operative site was surveyed, and it was found to be hemostatic.  No intraoperative injury to surrounding organs was noted.   The 33mm fascial port was closed with 0 vicryl.  Pictures were taken of the quadrants and pelvis. The abdomen was desufflated and all instruments were then removed from the patient's abdomen.   All incisions were closed with 4-0 Vicryl and Dermabond.   The patient tolerated the procedures well.  All instruments, needles, and sponge counts were correct x 2. The patient was taken to the recovery room in stable condition.

## 2021-10-02 NOTE — ED Notes (Signed)
Pt changed into gown and consent signed with provider in room

## 2021-10-02 NOTE — Anesthesia Procedure Notes (Signed)
Procedure Name: Intubation Date/Time: 10/02/2021 7:12 PM  Performed by: Elmarie Mainland, CRNAPre-anesthesia Checklist: Patient identified, Emergency Drugs available, Suction available and Patient being monitored Patient Re-evaluated:Patient Re-evaluated prior to induction Oxygen Delivery Method: Circle system utilized Preoxygenation: Pre-oxygenation with 100% oxygen Induction Type: IV induction, Rapid sequence and Cricoid Pressure applied Laryngoscope Size: McGraph and 3 Grade View: Grade I Tube type: Oral Tube size: 6.5 mm Number of attempts: 1 Airway Equipment and Method: Stylet and Video-laryngoscopy Placement Confirmation: ETT inserted through vocal cords under direct vision, positive ETCO2 and breath sounds checked- equal and bilateral Secured at: 21 cm Tube secured with: Tape Dental Injury: Teeth and Oropharynx as per pre-operative assessment

## 2021-10-03 ENCOUNTER — Encounter: Payer: Self-pay | Admitting: Obstetrics and Gynecology

## 2021-10-03 NOTE — Anesthesia Postprocedure Evaluation (Signed)
Anesthesia Post Note  Patient: Maria Zhang  Procedure(s) Performed: DIAGNOSTIC LAPAROSCOPY FOR POSSIBLE OVARIAN TORSION (Abdomen) LAPAROSCOPIC OOPHORECTOMY (Left: Abdomen)  Patient location during evaluation: PACU Anesthesia Type: General Level of consciousness: combative Pain management: pain level controlled Vital Signs Assessment: post-procedure vital signs reviewed and stable Respiratory status: spontaneous breathing, nonlabored ventilation and respiratory function stable Cardiovascular status: blood pressure returned to baseline and stable Postop Assessment: no apparent nausea or vomiting Anesthetic complications: no   No notable events documented.   Last Vitals:  Vitals:   10/02/21 2045 10/02/21 2059  BP: 123/74 114/84  Pulse: 70 70  Resp: 20 20  Temp:  (!) 36.3 C  SpO2: 100% 100%    Last Pain:  Vitals:   10/02/21 2059  TempSrc: Temporal  PainSc: 1                  Foye Deer

## 2021-10-04 LAB — SURGICAL PATHOLOGY

## 2022-03-16 ENCOUNTER — Other Ambulatory Visit: Payer: Self-pay | Admitting: Family Medicine

## 2022-03-16 DIAGNOSIS — N63 Unspecified lump in unspecified breast: Secondary | ICD-10-CM

## 2022-03-16 DIAGNOSIS — N6489 Other specified disorders of breast: Secondary | ICD-10-CM

## 2022-03-28 ENCOUNTER — Ambulatory Visit
Admission: RE | Admit: 2022-03-28 | Discharge: 2022-03-28 | Disposition: A | Discharge status: Home / Self Care | Payer: 59 | Source: Ambulatory Visit | Attending: Family Medicine | Admitting: Family Medicine

## 2022-03-28 DIAGNOSIS — N63 Unspecified lump in unspecified breast: Secondary | ICD-10-CM | POA: Insufficient documentation

## 2022-03-28 DIAGNOSIS — N6489 Other specified disorders of breast: Secondary | ICD-10-CM | POA: Insufficient documentation

## 2022-09-06 ENCOUNTER — Encounter: Payer: Self-pay | Admitting: Family Medicine

## 2022-09-11 ENCOUNTER — Other Ambulatory Visit: Payer: Self-pay | Admitting: Physician Assistant

## 2022-09-11 DIAGNOSIS — D249 Benign neoplasm of unspecified breast: Secondary | ICD-10-CM

## 2022-10-18 ENCOUNTER — Ambulatory Visit
Admission: RE | Admit: 2022-10-18 | Discharge: 2022-10-18 | Disposition: A | Discharge status: Home / Self Care | Payer: BC Managed Care – PPO | Source: Ambulatory Visit | Attending: Physician Assistant | Admitting: Physician Assistant

## 2022-10-18 DIAGNOSIS — D249 Benign neoplasm of unspecified breast: Secondary | ICD-10-CM | POA: Diagnosis not present

## 2023-06-08 IMAGING — MG DIGITAL DIAGNOSTIC BILAT W/ TOMO W/ CAD
6 of 10 series · 6 of 30 positions shown · non-contrast
Comparison: None.

CLINICAL DATA: Pain in the RIGHT axilla.

EXAM:
DIGITAL DIAGNOSTIC BILATERAL MAMMOGRAM WITH TOMOSYNTHESIS AND CAD;
ULTRASOUND RIGHT BREAST LIMITED
TECHNIQUE: Bilateral digital diagnostic mammography and breast tomosynthesis
was performed. The images were evaluated with computer-aided
detection.; Targeted ultrasound examination of the right breast was
performed

[L MLO synth-2D]
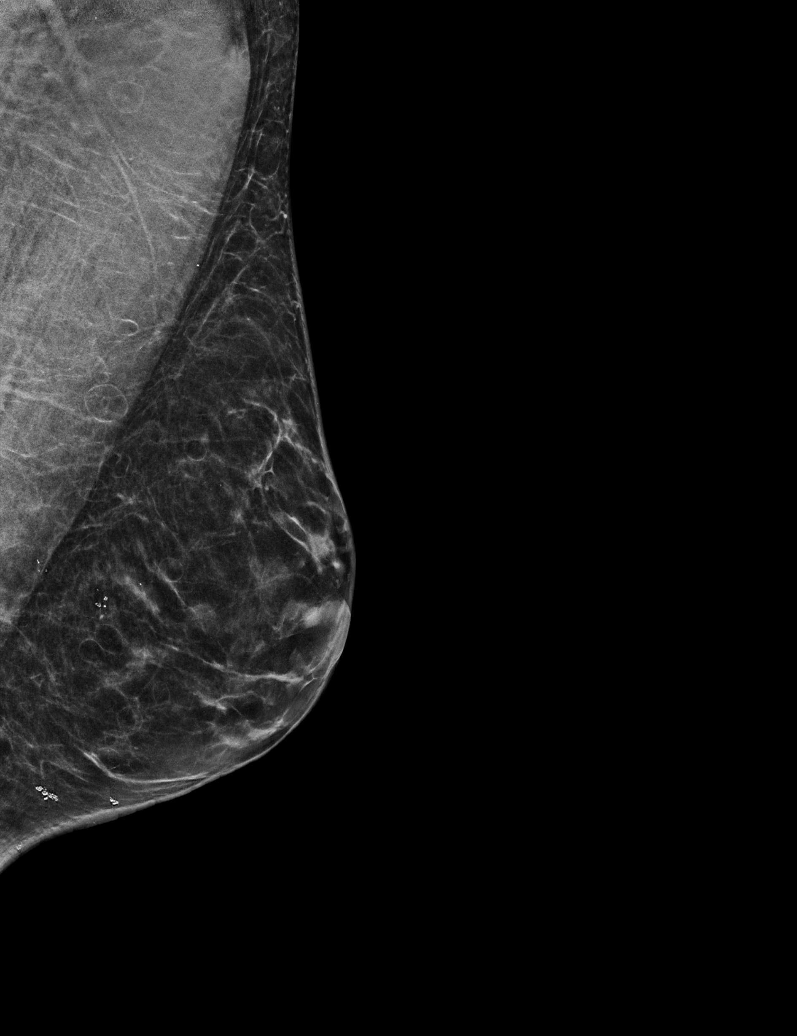

[R MLO synth-2D]
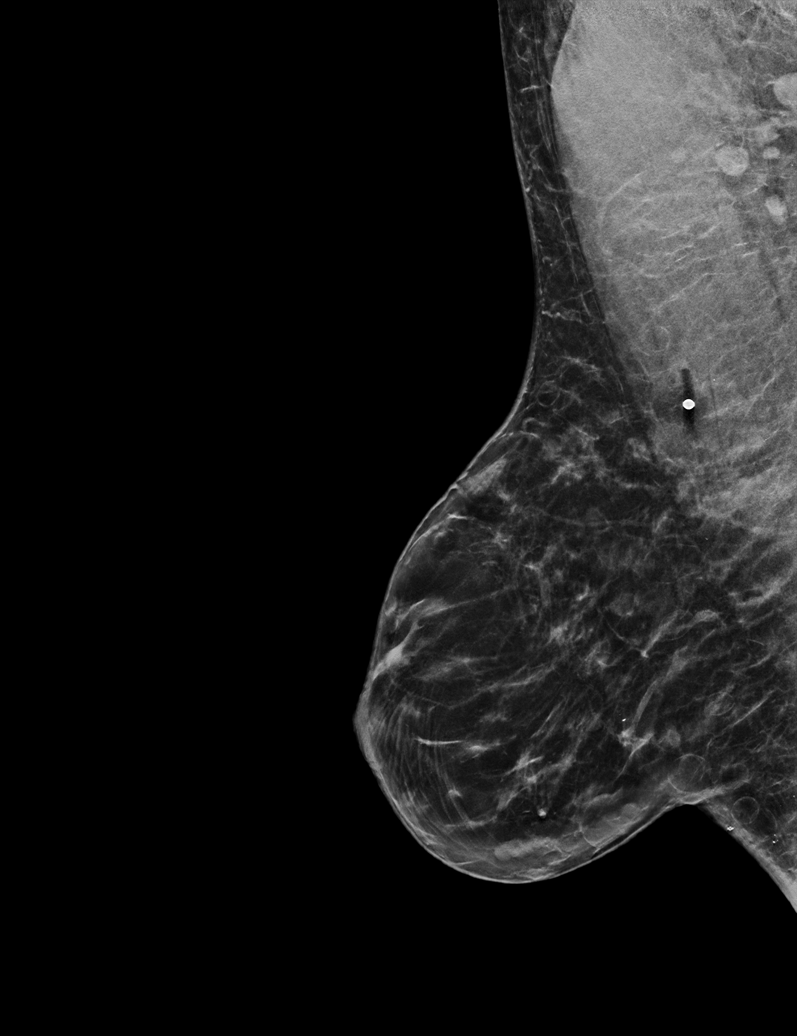

[R CC synth-2D (1 of 2)]
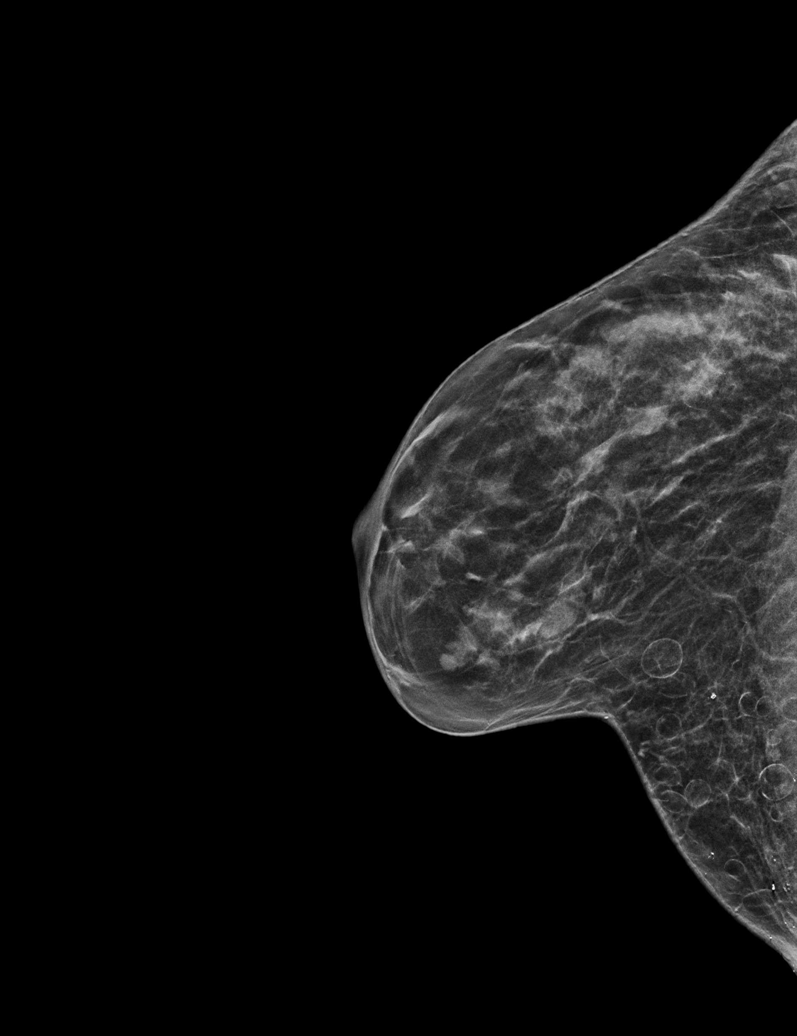

[R CC synth-2D (2 of 2)]
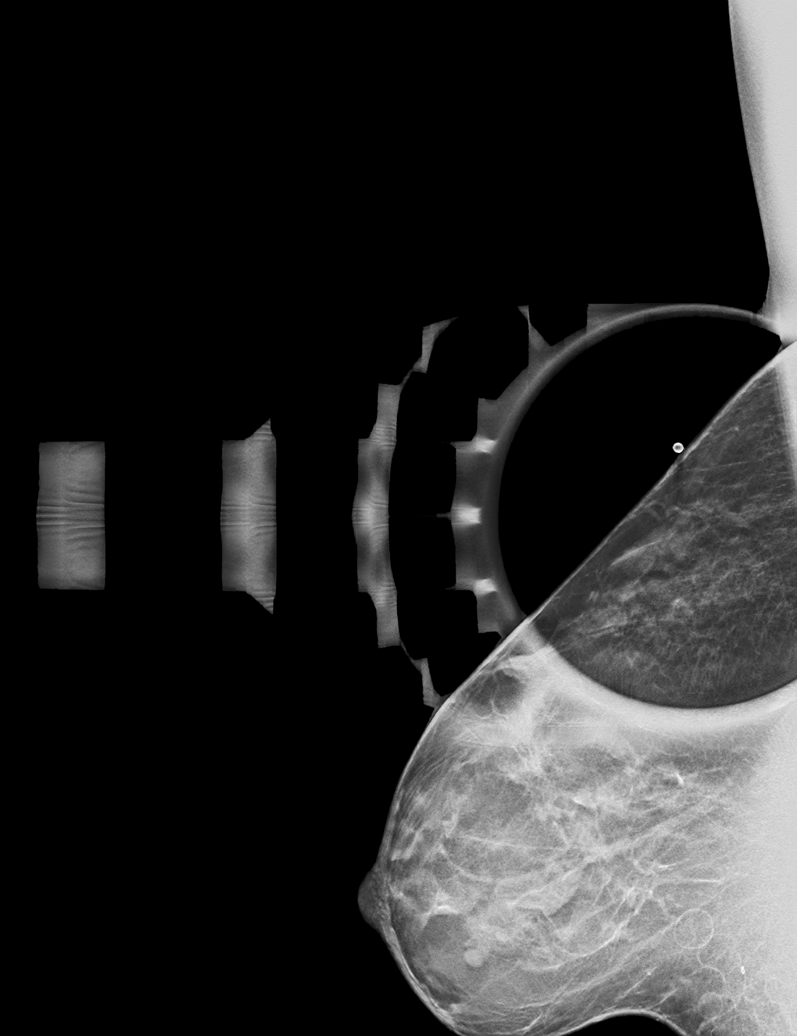

[L CC synth-2D]
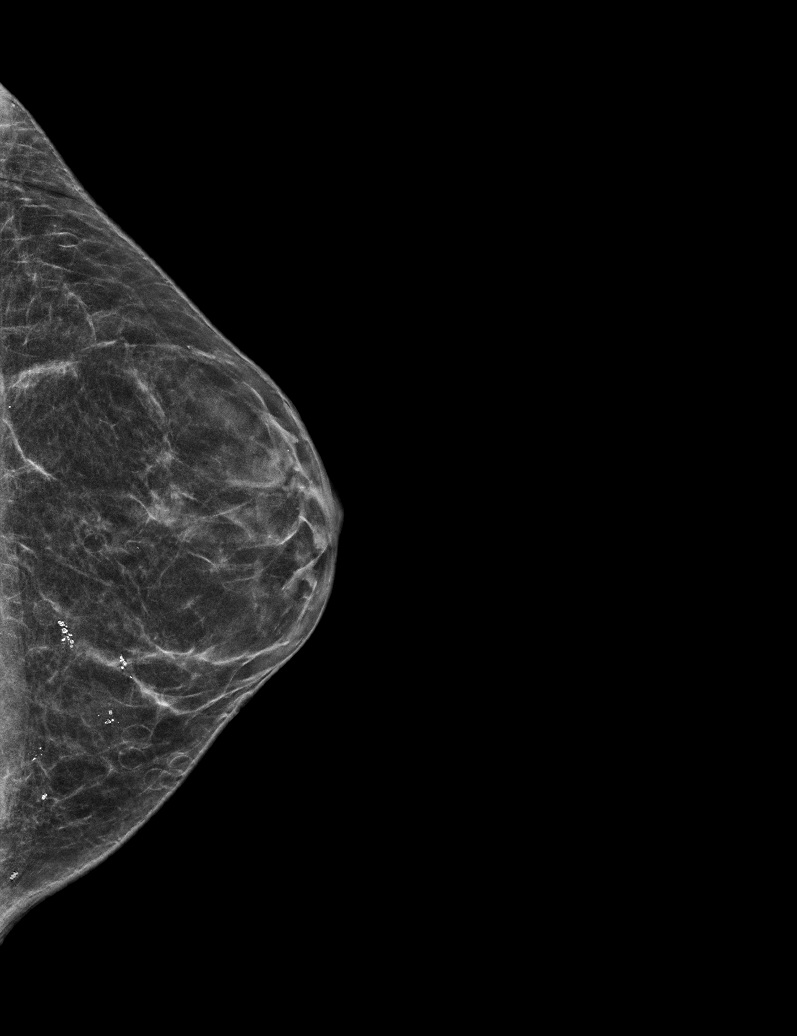

[L CC tomo · tomo slice 25/50.0]
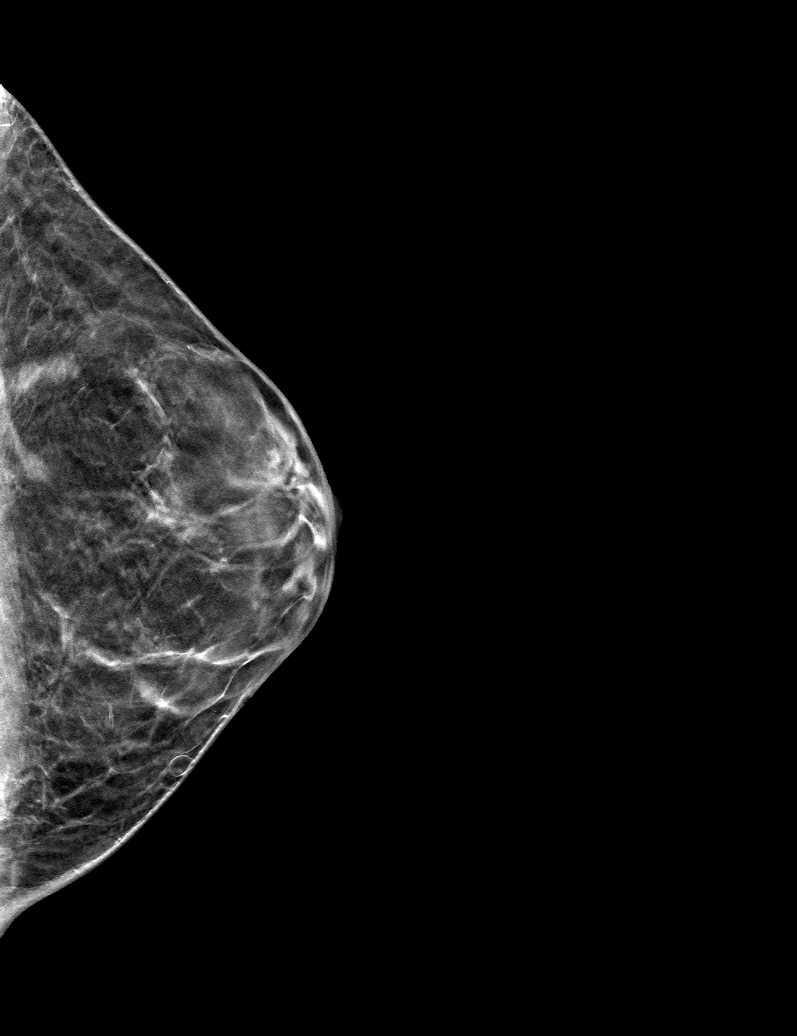

[6 of 30 positions shown; findings below may reference images not displayed]

ACR Breast Density Category c: The breast tissue is heterogeneously
dense, which may obscure small masses.
FINDINGS: In the RIGHT lower inner breast at middle depth, there is a focal
asymmetry. It is best seen on CC slice 43 and MLO slice 40. Two oval
circumscribed masses are noted mammographically in the outer breast
at middle to posterior depth (MLO slice 52). There are multiple
superficial oval circumscribed fat containing masses consistent with
steatocystoma multiplex.

Spot compression tomosynthesis views were obtained of the site of
painful concern in the RIGHT upper outer breast. No suspicious
mammographic etiology for breast pain is identified in this region.

No suspicious mass, distortion, or microcalcifications are
identified to suggest presence of malignancy in the LEFT breast.

On physical exam, multiple superficial nodules are noted.

Targeted ultrasound was performed of the site of focal pain in the
RIGHT upper outer breast. No suspicious cystic or solid mass seen at
the site of painful concern.

Targeted ultrasound was performed of the RIGHT lower inner breast.
At 5 o'clock 5 cm from the nipple, there is a superficial hypoechoic
mass with circumscribed margins. It measures 9 x 8 x 6 mm. This may
reflect a benign steatocystoma given presence of a similar size
steatocystoma at this location mammographically and similar
sonographic appearance of multiple adjacent steatocystomas. At 6
o'clock 1 cm from the nipple, there is a serpiginous collection of
oval circumscribed anechoic masses posterior acoustic enhancement.
They span approximately 3.2 cm and likely correspond to the focal
asymmetry noted mammographically.

Targeted ultrasound was performed of the RIGHT outer breast. At 8
o'clock 5 cm from the nipple, there is an oval circumscribed
anechoic mass with posterior acoustic enhancement. It measures 7 by
7 x 3 mm and is consistent with a benign simple cyst. Adjacent is a
superficial RIGHT reniform mass with internal hilar flow, thin
smooth cortex and a normal echogenic hilum consistent with a benign
intramammary lymph node. This measures 6 x 3 x 7 mm. These
correspond to the 2 oval circumscribed masses noted
mammographically.
IMPRESSION: 1. No mammographic or sonographic evidence of malignancy at the site
of painful concern in the RIGHT breast. Any further workup of the
patient's symptoms should be based on the clinical assessment.
2. There is a probably benign focal asymmetry in the RIGHT lower
inner breast with a favored sonographic correlate of multiple
adjacent benign cysts/tortuous duct. Recommend follow-up diagnostic
mammogram with ultrasound if deemed necessary in 6 months. This will
establish 6 months of definitive stability.
3. There is a probably benign mass in the RIGHT breast at 5 o'clock
5 cm from the nipple which likely reflects a benign steatocystoma.
Stability can be assessed with a follow-up ultrasound in 6 months.
4. No mammographic evidence of malignancy in the LEFT breast.
5. Benign steatocystoma multiplex is noted.

RECOMMENDATION:
RIGHT diagnostic mammogram and ultrasound in 6 months.

I have discussed the findings and recommendations with the patient.
If applicable, a reminder letter will be sent to the patient
regarding the next appointment.

BI-RADS CATEGORY  3: Probably benign.

## 2023-10-16 ENCOUNTER — Other Ambulatory Visit: Payer: Self-pay | Admitting: Physician Assistant

## 2023-10-16 DIAGNOSIS — N63 Unspecified lump in unspecified breast: Secondary | ICD-10-CM

## 2023-11-04 ENCOUNTER — Ambulatory Visit
Admission: RE | Admit: 2023-11-04 | Discharge: 2023-11-04 | Disposition: A | Discharge status: Home / Self Care | Source: Ambulatory Visit | Attending: Physician Assistant | Admitting: Physician Assistant

## 2023-11-04 DIAGNOSIS — N63 Unspecified lump in unspecified breast: Secondary | ICD-10-CM | POA: Insufficient documentation

## 2024-01-01 ENCOUNTER — Telehealth: Payer: Self-pay
